# Patient Record
Sex: Male | Born: 1945 | Race: Black or African American | Hispanic: No | Marital: Married | Smoking: Never smoker
Health system: Southern US, Community
[De-identification: ages and names within clinical notes are randomized; demographics above are authoritative.]

## PROBLEM LIST (undated history)

## (undated) DIAGNOSIS — C61 Malignant neoplasm of prostate: Secondary | ICD-10-CM

## (undated) DIAGNOSIS — I10 Essential (primary) hypertension: Secondary | ICD-10-CM

## (undated) DIAGNOSIS — Z923 Personal history of irradiation: Secondary | ICD-10-CM

## (undated) DIAGNOSIS — A64 Unspecified sexually transmitted disease: Secondary | ICD-10-CM

## (undated) HISTORY — PX: OTHER SURGICAL HISTORY: SHX169

---

## 1968-09-27 DIAGNOSIS — A64 Unspecified sexually transmitted disease: Secondary | ICD-10-CM

## 1968-09-27 HISTORY — DX: Unspecified sexually transmitted disease: A64

## 2002-09-27 DIAGNOSIS — Z923 Personal history of irradiation: Secondary | ICD-10-CM

## 2002-09-27 HISTORY — DX: Personal history of irradiation: Z92.3

## 2018-06-13 ENCOUNTER — Other Ambulatory Visit: Payer: Self-pay | Admitting: Urology

## 2018-06-13 DIAGNOSIS — R9721 Rising PSA following treatment for malignant neoplasm of prostate: Secondary | ICD-10-CM

## 2018-06-22 ENCOUNTER — Encounter (HOSPITAL_COMMUNITY)
Admission: RE | Admit: 2018-06-22 | Discharge: 2018-06-22 | Disposition: A | Discharge status: Home / Self Care | Payer: Medicare Other | Source: Ambulatory Visit | Attending: Urology | Admitting: Urology

## 2018-06-22 DIAGNOSIS — R9721 Rising PSA following treatment for malignant neoplasm of prostate: Secondary | ICD-10-CM | POA: Diagnosis not present

## 2018-06-22 MED ORDER — AXUMIN (FLUCICLOVINE F 18) INJECTION
9.5000 | Freq: Once | INTRAVENOUS | Status: AC
  Administered 2018-06-22: 9.5 via INTRAVENOUS

## 2018-07-13 ENCOUNTER — Encounter: Payer: Self-pay | Admitting: Medical Oncology

## 2018-07-13 ENCOUNTER — Telehealth: Payer: Self-pay | Admitting: Medical Oncology

## 2018-07-13 NOTE — Telephone Encounter (Signed)
Left message requesting a return call to discuss referral to the PMDC.  

## 2018-07-17 ENCOUNTER — Telehealth: Payer: Self-pay | Admitting: Medical Oncology

## 2018-07-17 NOTE — Telephone Encounter (Signed)
Left reminder message for [facility] appointment 10/22 arriving at 12:30 pm. I reviewed location, valet parking and reminded him to bring his completed medical forms. I also asked him to have lunch before arrival due to length of clinic.

## 2018-07-18 ENCOUNTER — Other Ambulatory Visit: Payer: Self-pay

## 2018-07-18 ENCOUNTER — Inpatient Hospital Stay: Payer: Medicare Other | Attending: Oncology | Admitting: Oncology

## 2018-07-18 ENCOUNTER — Ambulatory Visit
Admission: RE | Admit: 2018-07-18 | Discharge: 2018-07-18 | Disposition: A | Discharge status: Home / Self Care | Payer: Medicare Other | Source: Ambulatory Visit | Attending: Urology | Admitting: Urology

## 2018-07-18 ENCOUNTER — Encounter: Payer: Self-pay | Admitting: Medical Oncology

## 2018-07-18 ENCOUNTER — Encounter: Payer: Self-pay | Admitting: Radiation Oncology

## 2018-07-18 VITALS — BP 162/77 | HR 81 | Temp 98.2°F | Resp 18 | Ht 72.0 in | Wt 189.0 lb

## 2018-07-18 DIAGNOSIS — I10 Essential (primary) hypertension: Secondary | ICD-10-CM | POA: Insufficient documentation

## 2018-07-18 DIAGNOSIS — C61 Malignant neoplasm of prostate: Secondary | ICD-10-CM | POA: Insufficient documentation

## 2018-07-18 DIAGNOSIS — Z79899 Other long term (current) drug therapy: Secondary | ICD-10-CM | POA: Diagnosis not present

## 2018-07-18 HISTORY — DX: Personal history of irradiation: Z92.3

## 2018-07-18 HISTORY — DX: Malignant neoplasm of prostate: C61

## 2018-07-18 HISTORY — DX: Unspecified sexually transmitted disease: A64

## 2018-07-18 NOTE — Consult Note (Signed)
[facility]     07/18/2018   --------------------------------------------------------------------------------   Dale Ryan  MRN: [id number]  PRIMARY CARE:    DOB: 06/10/1946, 72 year old Male  REFERRING:  Irine Seal, MD  SSN:   PROVIDER:  Irine Seal, M.D.    TREATING:  Raynelle Bring, M.D.    LOCATION:  [facility] [phone]   --------------------------------------------------------------------------------   CC/HPI: CC: Prostate Cancer   Physician requesting consult: Dr. Irine Seal  PCP: [facility]  Location of consult: [facility] - Prostate Cancer Multidisciplinary Clinic   Dale Ryan is a 72 year old gentleman who was diagnosed with prostate cancer by Dr. Marla Roe and subsequently treated with EBRT at [facility] in 2004 for clinical stage T1c, low volume Gleason 3+3=6 adenocarcinoma with a pretreatment PSA of 10.9. He reached a PSA nadir of 1.3 in 2007. His PSA began increasing and had reached a level of 3.1 in February of 2012. He then stopped following up at [facility]. He had apparently not been undergoing PSA surveillance for many years and was recently noted to have an elevated PSA of 10.78 by his PCP. He was evaluated by Dr. Jeffie Pollock and his repeat PSA was 14.2. His DRE was unremarkable. He underwent a fluciclovine PET scan on 06/22/18 that did not demonstrate evidence of measurable metastatic disease. He then underwent a TRUS biopsy of the prostate on 07/05/18 that revealed a 106 cc prostate with 6 out of 12 biopsy cores positive for malignancy with Gleason 4+3=7 disease noted.   He remains in excellent overall health. He has significant longevity in his family with his mother living to be age 6 multiple grandparents that lived to be 49.   Family history: Positive   Imaging studies:  Fluciclovine PET (06/22/18) - negative for metastatic disease   PMH: He has a history of hypertension.  PSH: No abdominal  surgeries.   TNM stage: cT1c N0 M0  PSA: 14.2  Gleason score: 4+3=7  Biopsy (07/05/18): 6/12 cores positive  Left: L apex (30%, 3+4=7, PNI). L mid (5%, 4+3=7)  Right: R apex (70%, 3+4=7), R lateral apex (90%, 3+4=7, PNI), R mid (40%, 3+4=7), R lateral mid (40%, 3+4=7, PNI)  Prostate volume: 106 cc   Urinary function: IPSS is 2.  Erectile function: SHIM score is 5.     ALLERGIES: None   MEDICATIONS: Amlodipine Besylate 5 mg tablet 1 tablet PO Daily  Hydrochlorothiazide 12.5 mg capsule 1 capsule PO Daily     GU PSH: Prostate Needle Biopsy - 07/05/2018    NON-GU PSH: Surgical Pathology, Gross And Microscopic Examination For Prostate Needle - 07/05/2018    GU PMH: Elevated PSA - 06/28/2018 Prostate Cancer (Worsening) - 06/12/2018 Rising PSA after prostate cancer treatment, He has an elevated PSA 15 years post radiation for prostate cancer. I am going to get him set up for restaging with an Axumin PET scan. He will return with the results and subsequent therapy will depend on the location of the probable recurrent disease. - 06/12/2018      PMH Notes: STD  Radiation therapy for prostate cancer in 2004.    NON-GU PMH: Encounter for general adult medical examination without abnormal findings, Encounter for preventive health examination Hypertension    FAMILY HISTORY: Prostate Cancer - Runs in Family   SOCIAL HISTORY: Marital Status: Married Preferred Language: English; Race: Black or African American Current Smoking Status: Patient has never smoked.   Tobacco Use Assessment Completed: Used  Tobacco in last 30 days? Does not drink caffeine. Patient's occupation is/was Retired.    REVIEW OF SYSTEMS:    GU Review Male:   Patient denies frequent urination, hard to postpone urination, burning/ pain with urination, get up at night to urinate, leakage of urine, stream starts and stops, trouble starting your streams, and have to strain to urinate .  Gastrointestinal (Upper):   Patient  denies nausea and vomiting.  Gastrointestinal (Lower):   Patient denies diarrhea and constipation.  Constitutional:   Patient denies fever, night sweats, weight loss, and fatigue.  Skin:   Patient denies skin rash/ lesion and itching.  Eyes:   Patient denies blurred vision and double vision.  Ears/ Nose/ Throat:   Patient denies sore throat and sinus problems.  Hematologic/Lymphatic:   Patient denies swollen glands and easy bruising.  Cardiovascular:   Patient denies leg swelling and chest pains.  Respiratory:   Patient denies cough and shortness of breath.  Endocrine:   Patient denies excessive thirst.  Musculoskeletal:   Patient denies back pain and joint pain.  Neurological:   Patient denies headaches and dizziness.  Psychologic:   Patient denies depression and anxiety.   VITAL SIGNS: None   MULTI-SYSTEM PHYSICAL EXAMINATION:    Constitutional: Well-nourished. No physical deformities. Normally developed. Good grooming.     PAST DATA REVIEWED:  Source Of History:  Patient  Lab Test Review:   PSA  Records Review:   Pathology Reports  Urine Test Review:   Urinalysis  X-Ray Review: PET Scan: Reviewed Films.     06/28/18 05/09/18  PSA  Total PSA 14.20 ng/mL 10.78 ng/dl    PROCEDURES: None   ASSESSMENT:      ICD-10 Details  1 GU:   Prostate Cancer - C61    PLAN:           Document Letter(s):  Created for Patient: Clinical Summary         Notes:   1. Biochemically recurrent prostate cancer: I had a discussion with Dale Ryan today regarding his biochemically recurrent prostate cancer with evidence of local recurrence in the absence of clear systemic disease. We did discuss the need to proceed with appropriate bone imaging to absolutely rule out the possibility of systemic disease.   Assuming that he would not have evidence of metastatic disease, we then discuss the options of proceeding with salvage local curative therapy versus ongoing surveillance with plans to  institute systemic therapy in the future as a noncurative approach.   We discussed options for salvage local curative therapy including salvage radical prostatectomy and salvage ablative cryotherapy. We reviewed the pros and cons of each of these approaches and specifically discussed the potential side effects of these therapies particularly related to urinary, sexual, and bowel function. He did not appear to be particularly interested in salvage cryotherapy, and in particular, was very concerned about the small the possible risk for fistula formation. He did express some mild interest in salvage radical prostatectomy. We discussed the significant risk of urinary incontinence in the post radiation setting. I also discussed his PSA which is slightly increased above the level when we would like to detect potentially curable disease after radiation therapy. However, I would certainly offer him this option if the remainder of his metastatic evaluation is negative.   We also discussed the option of continuing with surveillance with a noncurative approach using systemic therapy in the future when necessary. He is scheduled to see Dr. Alen Blew later today. He understands  that his life expectancy is such that he very likely will developed metastatic and symptomatic disease if he does live as long as many of his family members. He understands that the benefit of this approach would be to preserve his quality of life currently. We did begin to review any of the side effects related to systemic therapy that would be necessary in the future.   He is going to consider his options of proceeding with salvage, local curative therapy likely with a salvage prostatectomy versus an approach of continued surveillance monitoring with a noncurative approach within institution of systemic therapy in the future when necessary. He feels well informed and will notify us of his decision and how he would like to proceed. If he does  ultimately wish to consider surgical treatment, we will plan to schedule this but I will have him return for a physical exam and further discussion preoperatively.    CC: Dr. Irine Seal    E & M CODE: I spent at least 52 minutes face to face with the patient, more than 50% of that time was spent on counseling and/or coordinating care.

## 2018-07-18 NOTE — Progress Notes (Signed)
Reason for the request: Prostate cancer     HPI: I was asked by Dr. Jeffie Pollock to evaluate Dale Ryan for prostate cancer.  He is a 72 year old man currently of [place] although he is originally from [place].  He was diagnosed with prostate cancer in 2004 and had a T1c disease with a Gleason score of 6.  He was treated with external beam radiation at that time with PSA nadir close to 1.  He did not follow-up at regularly after that and most recently his PSA was detected and was up to 10.78.  Repeat PSA by Dr. Jeffie Pollock showed a PSA of 14.2 on June 28, 2018.  He underwent an Axumin scan and PET images showed only uptake in the right apex of the prostate without any evidence of metastatic disease.  A biopsy obtained on 07/05/2018 showed a Gleason score of 4+3 equal 7 involving 5% of one core in addition of 3 other cores involving 3+4 equal 7 prostate cancer.  He is asymptomatic at this time without any urinary complaints.  He denies any frequency, dysuria or hematuria.  He does not report any headaches, blurry vision, syncope or seizures. Does not report any fevers, chills or sweats.  Does not report any cough, wheezing or hemoptysis.  Does not report any chest pain, palpitation, orthopnea or leg edema.  Does not report any nausea, vomiting or abdominal pain.  Does not report any constipation or diarrhea.  Does not report any skeletal complaints.    Does not report frequency, urgency or hematuria.  Does not report any skin rashes or lesions. Does not report any heat or cold intolerance.  Does not report any lymphadenopathy or petechiae.  Does not report any anxiety or depression.  Remaining review of systems is negative.    Past Medical History:  Diagnosis Date  . History of radiation therapy 2004  . Prostate cancer ([place])   . Venereal disease 1970  :  No past surgical history on file.:   Current Outpatient Medications:  .  amLODipine (NORVASC) 5 MG tablet, Take 5 mg by mouth daily., Disp: , Rfl:  0 .  desloratadine (CLARINEX) 5 MG tablet, Take 5 mg by mouth daily., Disp: , Rfl: 0 .  hydrochlorothiazide (MICROZIDE) 12.5 MG capsule, Take 12.5 mg by mouth daily., Disp: , Rfl: 0 .  levofloxacin (LEVAQUIN) 750 MG tablet, TAKE 1 TABLET BY MOUTH 1 HOUR PRIOR TO PROCEDURE, Disp: , Rfl: 0 .  Multiple Vitamin (MULTIVITAMIN) capsule, Take by mouth., Disp: , Rfl: :  Not on File:  No family history on file.:  Social History   Socioeconomic History  . Marital status: Married    Spouse name: Dale Ryan  . Number of children: 1  . Years of education: Not on file  . Highest education level: Not on file  Occupational History  . Occupation: retired  Scientific laboratory technician  . Financial resource strain: Not on file  . Food insecurity:    Worry: Not on file    Inability: Not on file  . Transportation needs:    Medical: Not on file    Non-medical: Not on file  Tobacco Use  . Smoking status: Never Smoker  . Smokeless tobacco: Never Used  Substance and Sexual Activity  . Alcohol use: Never    Frequency: Never  . Drug use: Never  . Sexual activity: Not Currently  Lifestyle  . Physical activity:    Days per week: Not on file    Minutes per session: Not on file  .  Stress: Not on file  Relationships  . Social connections:    Talks on phone: Not on file    Gets together: Not on file    Attends religious service: Not on file    Active member of club or organization: Not on file    Attends meetings of clubs or organizations: Not on file    Relationship status: Not on file  . Intimate partner violence:    Fear of current or ex partner: Not on file    Emotionally abused: Not on file    Physically abused: Not on file    Forced sexual activity: Not on file  Other Topics Concern  . Not on file  Social History Narrative  . Not on file  :  Pertinent items are noted in HPI.  Exam: ECOG 0 General appearance: alert and cooperative appeared without distress. Head: atraumatic without any  abnormalities. Eyes: conjunctivae/corneas clear. PERRL.  Sclera anicteric. Throat: lips, mucosa, and tongue normal; without oral thrush or ulcers. Resp: clear to auscultation bilaterally without rhonchi, wheezes or dullness to percussion. Cardio: regular rate and rhythm, S1, S2 normal, no murmur, click, rub or gallop GI: soft, non-tender; bowel sounds normal; no masses,  no organomegaly Skin: Skin color, texture, turgor normal. No rashes or lesions Lymph nodes: Cervical, supraclavicular, and axillary nodes normal. Neurologic: Grossly normal without any motor, sensory or deep tendon reflexes. Musculoskeletal: No joint deformity or effusion.     Nm Pet (axumin) Skull Base To Mid Thigh  Result Date: 06/22/2018 CLINICAL DATA:  Prostate carcinoma with biochemical recurrence. PSA equal 10.7 EXAM: NUCLEAR MEDICINE PET SKULL BASE TO THIGH TECHNIQUE: 9.5 mCi F-18 Fluciclovine was injected intravenously. Full-ring PET imaging was performed from the skull base to thigh after the radiotracer. CT data was obtained and used for attenuation correction and anatomic localization. COMPARISON:  None. FINDINGS: NECK No radiotracer activity in neck lymph nodes. Incidental CT finding: None CHEST No radiotracer accumulation within mediastinal or hilar lymph nodes. No suspicious pulmonary nodules on the CT scan. Incidental CT finding: None ABDOMEN/PELVIS Prostate: Asymmetric activity within the posterior RIGHT apex with SUV max equal 6.1. Lymph nodes: No abnormal radiotracer accumulation within pelvic or abdominal nodes. Liver: No evidence of liver metastasis Incidental CT finding: Large hepatic cysts. SKELETON No focal activity to suggest skeletal metastasis. Remote pubic symphysis fractures versus chronic degenerative change. IMPRESSION: 1. Focal activity in the RIGHT lobe of the prostate gland may represent residual or recurrent prostate carcinoma. 2. No evidence of metastatic adenopathy in the pelvis. 3. No evidence of  distant soft tissue metastasis. 4. No evidence skeletal metastasis. Electronically Signed   By: Suzy Bouchard M.D.   On: 06/22/2018 17:26    Assessment and Plan:    72 year old man prostate cancer diagnosed in 2004 we had a Gleason score 3+3 equal 6, T1c disease.  He was treated with external beam radiation and developed recurrent disease that has been documented on a biopsy in October 2019 with a Gleason score 4+3 equal 7.  His case was discussed today in the prostate cancer multidisciplinary clinic.  His imaging studies were discussed with radiology and his pathology slides were reviewed by radiology.  Treatment options were reviewed today with the patient.  These options would include definitive local therapy with cryoablation or salvage prostatectomy.  Alternatively, deferring therapy for the time being which will increase his risk of developing locally advanced disease and possibly metastatic disease.  The role for systemic therapy in the setting of advanced disease  was discussed today.  He understands at that point, treatments are palliative and not curative.  These options would include androgen deprivation therapy, second line hormone therapy, immune therapy and systemic chemotherapy.  After discussion today, he is leaning towards salvage prostatectomy which he has discussed with Dr. Alinda Money today.  30  minutes was spent with the patient face-to-face today.  More than 50% of time was dedicated to patient counseling, education and reviewing imaging studies and pathology.   Thank you for the referral. A copy of this consult has been forwarded to the requesting physician.

## 2018-07-18 NOTE — Progress Notes (Signed)
GU Location of Tumor / Histology: prostatic adenocarcinoma  If Prostate Cancer, Gleason Score is (4 + 3) and PSA is (14.20) 15 years out from EXRT.       Past/Anticipated interventions by urology, if any: biopsy, axumin study, referral to [facility]  Past/Anticipated interventions by medical oncology, if any: no  Weight changes, if any: no  Bowel/Bladder complaints, if any: minimal LUTS   Nausea/Vomiting, if any: no  Pain issues, if any:  no  SAFETY ISSUES:  Prior radiation? Yes in 2004  Pacemaker/ICD? no  Possible current pregnancy? no  Is the patient on methotrexate? no  Current Complaints / other details:  72 year old male. Married. Retired.

## 2018-07-18 NOTE — Progress Notes (Signed)
                               Care Plan Summary  Name: Mr. Muhammed Teutsch DOB: October 22, 1945   Your Medical Team:   Urologist -  Dr. Raynelle Bring, [facility]  Radiation Oncologist - Dr. Tyler Pita, [facility]   Medical Oncologist - Dr. Zola Button, [facility]  Recommendations: 1) Bone Scan- staging 2) Continue surveillance  3) Salvage  prostatectomy  * These recommendations are based on information available as of today's consult.      Recommendations may change depending on the results of further tests or exams.  Next Steps: 1) Dr. Ralene Muskrat office will schedule  bone scan  2) Consider your options and call Robin or Dr. Jeffie Pollock   When appointments need to be scheduled, you will be contacted by [facility] and/or [facility].  Questions?  Please do not hesitate to call Cira Rue, RN, BSN, OCN at [phone] any questions or concerns.  Shirlean Mylar is your Oncology Nurse Navigator and is available to assist you while you're receiving your medical care at [facility].

## 2018-07-19 ENCOUNTER — Other Ambulatory Visit: Payer: Self-pay | Admitting: Urology

## 2018-07-19 ENCOUNTER — Telehealth: Payer: Self-pay

## 2018-07-19 DIAGNOSIS — C61 Malignant neoplasm of prostate: Secondary | ICD-10-CM

## 2018-07-19 DIAGNOSIS — R9721 Rising PSA following treatment for malignant neoplasm of prostate: Secondary | ICD-10-CM

## 2018-07-19 NOTE — Telephone Encounter (Signed)
Per 10/22 no los 

## 2018-07-20 ENCOUNTER — Encounter: Payer: Self-pay | Admitting: General Practice

## 2018-07-20 NOTE — Progress Notes (Signed)
[place] Psychosocial Distress Screening Spiritual Care  Mr Reifsteck was seen in [facility] to introduce [facility] team/resources, reviewing distress screen per protocol.  The patient scored a 0 on the Psychosocial Distress Thermometer which indicates minimal distress.   ONCBCN DISTRESS SCREENING 07/20/2018  Screening Type Initial Screening  Distress experienced in past week (1-10) 0  Referral to support programs Yes    Follow up needed: No. Mr Klaus received full packet of [place]. LVM offering further support via [place], but please page if immediate needs arise or circumstances change. Thank you.   [place], [place], [facility] Pager [phone] Voicemail [phone]

## 2018-07-25 ENCOUNTER — Telehealth: Payer: Self-pay | Admitting: Medical Oncology

## 2018-07-25 NOTE — Telephone Encounter (Signed)
Left message as follow up to [facility]. He is scheduled for bone scan 11/01 arriving at 10:00 am [facility]. I asked him to call me with questions or concerns.

## 2018-07-28 ENCOUNTER — Encounter (HOSPITAL_COMMUNITY)
Admission: RE | Admit: 2018-07-28 | Discharge: 2018-07-28 | Disposition: A | Discharge status: Home / Self Care | Payer: Medicare Other | Source: Ambulatory Visit | Attending: Urology | Admitting: Urology

## 2018-07-28 DIAGNOSIS — R9721 Rising PSA following treatment for malignant neoplasm of prostate: Secondary | ICD-10-CM | POA: Diagnosis present

## 2018-07-28 DIAGNOSIS — C61 Malignant neoplasm of prostate: Secondary | ICD-10-CM | POA: Insufficient documentation

## 2018-07-28 MED ORDER — TECHNETIUM TC 99M MEDRONATE IV KIT
20.0000 | PACK | Freq: Once | INTRAVENOUS | Status: AC | PRN
  Administered 2018-07-28: 21.8 via INTRAVENOUS

## 2018-08-17 ENCOUNTER — Telehealth: Payer: Self-pay | Admitting: Medical Oncology

## 2018-08-17 NOTE — Telephone Encounter (Signed)
Patient called stating he has decided to move forward with salvage prostatectomy. He I snot sure how to get in touch with Dr. Alinda Money with his treatment decision. I will be happy to forward this decision to Dr. Alinda Money. He is aware he will receive a call from Dr. Lynne Logan office to schedule a follow up appointment to discuss surgery. Message forwarded to Dr. Alinda Money.

## 2018-08-18 ENCOUNTER — Telehealth: Payer: Self-pay | Admitting: Medical Oncology

## 2018-08-18 NOTE — Telephone Encounter (Signed)
Spoke with Dale Ryan to inform him of appointment with Dr. Alinda Money 11/26 at 8:00 am to discuss salvage prostatectomy. He voiced understanding.

## 2018-08-22 ENCOUNTER — Other Ambulatory Visit: Payer: Self-pay | Admitting: Urology

## 2018-09-08 NOTE — Patient Instructions (Signed)
Dale Ryan  09/08/2018   Your procedure is scheduled on: Thursday 09/14/2018  Report to [facility] Main  Entrance              Report to admitting at  0530  AM    Call this number if you have problems the morning of surgery [phone]               Follow bowel prep instructions from Dr. Lynne Logan office the day before  surgery and follow a clear liquid diet all day up until midnight!             Drink one 8 ounce bottle Magnesium Citrate at noon the day before surgery.             Use one Fleet enema night  before surgery.    CLEAR LIQUID DIET   Foods Allowed                                                                     Foods Excluded  Coffee and tea, regular and decaf                             liquids that you cannot  Plain Jell-O in any flavor                                             see through such as: Fruit ices (not with fruit pulp)                                     milk, soups, orange juice  Iced Popsicles                                    All solid food Carbonated beverages, regular and diet                                    Cranberry, grape and apple juices Sports drinks like Gatorade Lightly seasoned clear broth or consume(fat free) Sugar, honey syrup  Sample Menu Breakfast                                Lunch                                     Supper Cranberry juice                    Beef broth                            Chicken broth Jell-O  Grape juice                           Apple juice Coffee or tea                        Jell-O                                      Popsicle                                                Coffee or tea                        Coffee or tea  _____________________________________________________________________    Remember: Do not eat food or drink liquids :After Midnight.              BRUSH YOUR TEETH MORNING OF SURGERY AND RINSE YOUR MOUTH  OUT, NO CHEWING GUM CANDY OR MINTS.     Take these medicines the morning of surgery with A SIP OF WATER: Amlodipine (Norvasc)                                You may not have any metal on your body including hair pins and              piercings  Do not wear jewelry, make-up, lotions, powders or perfumes, deodorant                         Men may shave face and neck.   Do not bring valuables to the hospital. [place].  Contacts, dentures or bridgework may not be worn into surgery.  Leave suitcase in the car. After surgery it may be brought to your room.                  Please read over the following fact sheets you were given: _____________________________________________________________________             [facility] - Preparing for Surgery Before surgery, you can play an important role.  Because skin is not sterile, your skin needs to be as free of germs as possible.  You can reduce the number of germs on your skin by washing with CHG (chlorahexidine gluconate) soap before surgery.  CHG is an antiseptic cleaner which kills germs and bonds with the skin to continue killing germs even after washing. Please DO NOT use if you have an allergy to CHG or antibacterial soaps.  If your skin becomes reddened/irritated stop using the CHG and inform your nurse when you arrive at Short Stay. Do not shave (including legs and underarms) for at least 48 hours prior to the first CHG shower.  You may shave your face/neck. Please follow these instructions carefully:  1.  Shower with CHG Soap the night before surgery and the  morning of Surgery.  2.  If you choose to wash your hair, wash your hair first as usual with your  normal  shampoo.  3.  After you shampoo, rinse your hair and body thoroughly to remove the  shampoo.                           4.  Use CHG as you would any other liquid soap.  You can apply chg directly  to the skin and wash                        Gently with a scrungie or clean washcloth.  5.  Apply the CHG Soap to your body ONLY FROM THE NECK DOWN.   Do not use on face/ open                           Wound or open sores. Avoid contact with eyes, ears mouth and genitals (private parts).                       Wash face,  Genitals (private parts) with your normal soap.             6.  Wash thoroughly, paying special attention to the area where your surgery  will be performed.  7.  Thoroughly rinse your body with warm water from the neck down.  8.  DO NOT shower/wash with your normal soap after using and rinsing off  the CHG Soap.                9.  Pat yourself dry with a clean towel.            10.  Wear clean pajamas.            11.  Place clean sheets on your bed the night of your first shower and do not  sleep with pets. Day of Surgery : Do not apply any lotions/deodorants the morning of surgery.  Please wear clean clothes to the hospital/surgery center.  FAILURE TO FOLLOW THESE INSTRUCTIONS MAY RESULT IN THE CANCELLATION OF YOUR SURGERY PATIENT SIGNATURE_________________________________  NURSE SIGNATURE__________________________________  ________________________________________________________________________   Dale Ryan  An incentive spirometer is a tool that can help keep your lungs clear and active. This tool measures how well you are filling your lungs with each breath. Taking long deep breaths may help reverse or decrease the chance of developing breathing (pulmonary) problems (especially infection) following:  A long period of time when you are unable to move or be active. BEFORE THE PROCEDURE   If the spirometer includes an indicator to show your best effort, your nurse or respiratory therapist will set it to a desired goal.  If possible, sit up straight or lean slightly forward. Try not to slouch.  Hold the incentive spirometer in an upright position. INSTRUCTIONS FOR USE  1. Sit on the edge  of your bed if possible, or sit up as far as you can in bed or on a chair. 2. Hold the incentive spirometer in an upright position. 3. Breathe out normally. 4. Place the mouthpiece in your mouth and seal your lips tightly around it. 5. Breathe in slowly and as deeply as possible, raising the piston or the ball toward the top of the column. 6. Hold your breath for 3-5 seconds or for as long as possible. Allow the piston or ball to fall to the bottom of the column. 7. Remove the mouthpiece from your mouth and  breathe out normally. 8. Rest for a few seconds and repeat Steps 1 through 7 at least 10 times every 1-2 hours when you are awake. Take your time and take a few normal breaths between deep breaths. 9. The spirometer may include an indicator to show your best effort. Use the indicator as a goal to work toward during each repetition. 10. After each set of 10 deep breaths, practice coughing to be sure your lungs are clear. If you have an incision (the cut made at the time of surgery), support your incision when coughing by placing a pillow or rolled up towels firmly against it. Once you are able to get out of bed, walk around indoors and cough well. You may stop using the incentive spirometer when instructed by your caregiver.  RISKS AND COMPLICATIONS  Take your time so you do not get dizzy or light-headed.  If you are in pain, you may need to take or ask for pain medication before doing incentive spirometry. It is harder to take a deep breath if you are having pain. AFTER USE  Rest and breathe slowly and easily.  It can be helpful to keep track of a log of your progress. Your caregiver can provide you with a simple table to help with this. If you are using the spirometer at home, follow these instructions: [place] IF:   You are having difficultly using the spirometer.  You have trouble using the spirometer as often as instructed.  Your pain medication is not giving enough  relief while using the spirometer.  You develop fever of 100.5 F (38.1 C) or higher. SEEK IMMEDIATE MEDICAL CARE IF:   You cough up bloody sputum that had not been present before.  You develop fever of 102 F (38.9 C) or greater.  You develop worsening pain at or near the incision site. MAKE SURE YOU:   Understand these instructions.  Will watch your condition.  Will get help right away if you are not doing well or get worse. Document Released: 01/24/2007 Document Revised: 12/06/2011 Document Reviewed: 03/27/2007 Exit[employer] Patient Information 2014 Exit[employer], [place].   ________________________________________________________________________  WHAT IS A BLOOD TRANSFUSION? Blood Transfusion Information  A transfusion is the replacement of blood or some of its parts. Blood is made up of multiple cells which provide different functions.  Red blood cells carry oxygen and are used for blood loss replacement.  White blood cells fight against infection.  Platelets control bleeding.  Plasma helps clot blood.  Other blood products are available for specialized needs, such as hemophilia or other clotting disorders. BEFORE THE TRANSFUSION  Who gives blood for transfusions?   Healthy volunteers who are fully evaluated to make sure their blood is safe. This is blood bank blood. Transfusion therapy is the safest it has ever been in the practice of medicine. Before blood is taken from a donor, a complete history is taken to make sure that person has no history of diseases nor engages in risky social behavior (examples are intravenous drug use or sexual activity with multiple partners). The donor's travel history is screened to minimize risk of transmitting infections, such as malaria. The donated blood is tested for signs of infectious diseases, such as HIV and hepatitis. The blood is then tested to be sure it is compatible with you in order to minimize the chance of a transfusion reaction. If  you or a relative donates blood, this is often done in anticipation of surgery and is not appropriate for  emergency situations. It takes many days to process the donated blood. RISKS AND COMPLICATIONS Although transfusion therapy is very safe and saves many lives, the main dangers of transfusion include:   Getting an infectious disease.  Developing a transfusion reaction. This is an allergic reaction to something in the blood you were given. Every precaution is taken to prevent this. The decision to have a blood transfusion has been considered carefully by your caregiver before blood is given. Blood is not given unless the benefits outweigh the risks. AFTER THE TRANSFUSION  Right after receiving a blood transfusion, you will usually feel much better and more energetic. This is especially true if your red blood cells have gotten low (anemic). The transfusion raises the level of the red blood cells which carry oxygen, and this usually causes an energy increase.  The nurse administering the transfusion will monitor you carefully for complications. HOME CARE INSTRUCTIONS  No special instructions are needed after a transfusion. You may find your energy is better. Speak with your caregiver about any limitations on activity for underlying diseases you may have. SEEK MEDICAL CARE IF:   Your condition is not improving after your transfusion.  You develop redness or irritation at the intravenous (IV) site. SEEK IMMEDIATE MEDICAL CARE IF:  Any of the following symptoms occur over the next 12 hours:  Shaking chills.  You have a temperature by mouth above 102 F (38.9 C), not controlled by medicine.  Chest, back, or muscle pain.  People around you feel you are not acting correctly or are confused.  Shortness of breath or difficulty breathing.  Dizziness and fainting.  You get a rash or develop hives.  You have a decrease in urine output.  Your urine turns a dark color or changes to pink,  red, or brown. Any of the following symptoms occur over the next 10 days:  You have a temperature by mouth above 102 F (38.9 C), not controlled by medicine.  Shortness of breath.  Weakness after normal activity.  The white part of the eye turns yellow (jaundice).  You have a decrease in the amount of urine or are urinating less often.  Your urine turns a dark color or changes to pink, red, or brown. Document Released: 09/10/2000 Document Revised: 12/06/2011 Document Reviewed: 04/29/2008 [facility] Patient Information 2014 [place], [place].  _______________________________________________________________________

## 2018-09-11 ENCOUNTER — Other Ambulatory Visit: Payer: Self-pay

## 2018-09-11 ENCOUNTER — Encounter (HOSPITAL_COMMUNITY)
Admission: RE | Admit: 2018-09-11 | Discharge: 2018-09-11 | Disposition: A | Discharge status: Home / Self Care | Payer: Medicare Other | Source: Ambulatory Visit | Attending: Urology | Admitting: Urology

## 2018-09-11 ENCOUNTER — Encounter (HOSPITAL_COMMUNITY): Payer: Self-pay

## 2018-09-11 DIAGNOSIS — Z01818 Encounter for other preprocedural examination: Secondary | ICD-10-CM | POA: Insufficient documentation

## 2018-09-11 DIAGNOSIS — I10 Essential (primary) hypertension: Secondary | ICD-10-CM

## 2018-09-11 DIAGNOSIS — Z923 Personal history of irradiation: Secondary | ICD-10-CM | POA: Diagnosis not present

## 2018-09-11 DIAGNOSIS — C61 Malignant neoplasm of prostate: Secondary | ICD-10-CM | POA: Diagnosis not present

## 2018-09-11 DIAGNOSIS — Z79899 Other long term (current) drug therapy: Secondary | ICD-10-CM | POA: Diagnosis not present

## 2018-09-11 HISTORY — DX: Essential (primary) hypertension: I10

## 2018-09-11 LAB — BASIC METABOLIC PANEL
Anion gap: 8 (ref 5–15)
BUN: 15 mg/dL (ref 8–23)
CALCIUM: 8.9 mg/dL (ref 8.9–10.3)
CO2: 28 mmol/L (ref 22–32)
Chloride: 104 mmol/L (ref 98–111)
Creatinine, Ser: 1.09 mg/dL (ref 0.61–1.24)
GFR calc Af Amer: >60 mL/min (ref >60)
Glucose, Bld: 105 mg/dL — ABNORMAL HIGH (ref 70–99)
Potassium: 3.6 mmol/L (ref 3.5–5.1)
SODIUM: 140 mmol/L (ref 135–145)

## 2018-09-11 LAB — CBC
HCT: 40.2 % (ref 39.0–52.0)
Hemoglobin: 13.1 g/dL (ref 13.0–17.0)
MCH: 29.4 pg (ref 26.0–34.0)
MCHC: 32.6 g/dL (ref 30.0–36.0)
MCV: 90.3 fL (ref 80.0–100.0)
Platelets: 210 10*3/uL (ref 150–400)
RBC: 4.45 MIL/uL (ref 4.22–5.81)
RDW: 15.5 % (ref 11.5–15.5)
WBC: 3.5 10*3/uL — ABNORMAL LOW (ref 4.0–10.5)
nRBC: 0 % (ref 0.0–0.2)

## 2018-09-11 LAB — ABO/RH: ABO/RH(D): O POS

## 2018-09-11 MED ORDER — MAGNESIUM CITRATE PO SOLN
1.0000 | Freq: Once | ORAL | Status: DC
  Filled 2018-09-11: qty 296

## 2018-09-11 MED ORDER — FLEET ENEMA 7-19 GM/118ML RE ENEM
1.0000 | ENEMA | Freq: Once | RECTAL | Status: DC
  Filled 2018-09-11: qty 1

## 2018-09-13 NOTE — Anesthesia Preprocedure Evaluation (Addendum)
Anesthesia Evaluation  Patient identified by MRN, date of birth, ID band Patient awake    Reviewed: Allergy & Precautions, H&P , NPO status , Patient's Chart, lab work & pertinent test results  Airway Mallampati: II  TM Distance: >3 FB Neck ROM: Full    Dental no notable dental hx. (+) Teeth Intact, Dental Advisory Given   Pulmonary neg pulmonary ROS,    Pulmonary exam normal breath sounds clear to auscultation       Cardiovascular Exercise Tolerance: Good hypertension, Pt. on medications  Rhythm:Regular Rate:Normal     Neuro/Psych negative neurological ROS  negative psych ROS   GI/Hepatic negative GI ROS, Neg liver ROS,   Endo/Other  negative endocrine ROS  Renal/GU negative Renal ROS  negative genitourinary   Musculoskeletal   Abdominal   Peds  Hematology negative hematology ROS (+)   Anesthesia Other Findings   Reproductive/Obstetrics negative OB ROS                           Anesthesia Physical Anesthesia Plan  ASA: II  Anesthesia Plan: General   Post-op Pain Management:    Induction: Intravenous  PONV Risk Score and Plan: 3 and Ondansetron, Dexamethasone and Midazolam  Airway Management Planned: Oral ETT  Additional Equipment:   Intra-op Plan:   Post-operative Plan: Extubation in OR  Informed Consent: I have reviewed the patients History and Physical, chart, labs and discussed the procedure including the risks, benefits and alternatives for the proposed anesthesia with the patient or authorized representative who has indicated his/her understanding and acceptance.     Dental advisory given  Plan Discussed with: CRNA  Anesthesia Plan Comments:         Anesthesia Quick Evaluation  

## 2018-09-13 NOTE — H&P (Signed)
Office Visit Report     08/22/2018   --------------------------------------------------------------------------------   Dale Ryan  MRN: [id number]  PRIMARY CARE:    DOB: [birth year], 72 year old Male  REFERRING:  [place]  SSN:   PROVIDER:  Irine Seal, M.D.    TREATING:  Raynelle Bring, M.D.    LOCATION:  [facility] [phone]   --------------------------------------------------------------------------------   CC/HPI: CC: Prostate Cancer   Dale Ryan is a 72 year old gentleman who was diagnosed with prostate cancer by Dr. 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 He reached a PSA nadir of 1.3 in 2007. His PSA began increasing and had reached a level of 3.1 in February of 2012. He then stopped following up at [facility]. He had apparently not been undergoing PSA surveillance for many years and was recently noted to have an elevated PSA of 10.78 by his PCP. He was evaluated by Dr. Jeffie Pollock and his repeat PSA was 14.2. His DRE was unremarkable. He underwent a fluciclovine PET scan on 06/22/18 that did not demonstrate evidence of measurable metastatic disease. He then underwent a TRUS biopsy of the prostate on 07/05/18 that revealed a 106 cc prostate with 6 out of 12 biopsy cores positive for malignancy with Gleason 4+3=7 disease noted. He was recently seen in the multidisciplinary clinic and we discussed options for management. He has now elected to consider salvage prostatectomy for treatment.   He remains in excellent overall health. He has significant longevity in his family with his mother living to be age 43 multiple grandparents that lived to be 21.   Family history: Positive   Imaging studies:  Fluciclovine PET (06/22/18) - negative for metastatic disease  Bone scan (07/28/2018) - negative for metastatic disease    PMH: He has a history of hypertension.  PSH: No abdominal surgeries.   TNM stage: cT1c N0 M0  PSA: 14.2  Gleason score: 4+3=7  Biopsy (07/05/18): 6/12 cores positive  Left: L apex (30%, 3+4=7, PNI). L mid (5%, 4+3=7)  Right: R apex (70%, 3+4=7), R lateral apex (90%, 3+4=7, PNI), R mid (40%, 3+4=7), R lateral mid (40%, 3+4=7, PNI)  Prostate volume: 106 cc   Urinary function: IPSS is 2.  Erectile function: SHIM score is 5.     ALLERGIES: None   MEDICATIONS: Amlodipine Besylate 5 mg tablet 1 tablet PO Daily  Hydrochlorothiazide 12.5 mg capsule 1 capsule PO Daily     GU PSH: Prostate Needle Biopsy - 07/05/2018    NON-GU PSH: Surgical Pathology, Gross And Microscopic Examination For Prostate Needle - 07/05/2018    GU PMH: Prostate Cancer (Worsening) - 06/12/2018 Rising PSA after prostate cancer treatment, He has an elevated PSA 15 years post radiation for prostate cancer. I am going to get him set up for restaging with an Axumin PET scan. He will return with the results and subsequent therapy will depend on the location of the probable recurrent disease. - 06/12/2018      PMH Notes: STD  Radiation therapy for prostate cancer in 2004.    NON-GU PMH: Hypertension    FAMILY HISTORY: Prostate Cancer - Runs in Family   SOCIAL HISTORY: Marital Status: Married Preferred Language: English; Race: Black or African American Current Smoking Status: Patient has never smoked.   Tobacco Use Assessment Completed: Used Tobacco in last 30 days? Does not  drink caffeine. Patient's occupation is/was Retired.    REVIEW OF SYSTEMS:    GU Review Male:   Patient denies frequent urination, hard to postpone urination, burning/ pain with urination, get up at night to urinate, leakage of urine, stream starts and stops, trouble starting your streams, and have to strain to urinate .  Gastrointestinal (Upper):   Patient denies nausea and vomiting.  Gastrointestinal (Lower):   Patient denies diarrhea and  constipation.  Constitutional:   Patient denies fever, night sweats, weight loss, and fatigue.  Skin:   Patient denies skin rash/ lesion and itching.  Eyes:   Patient denies blurred vision and double vision.  Ears/ Nose/ Throat:   Patient denies sore throat and sinus problems.  Hematologic/Lymphatic:   Patient denies swollen glands and easy bruising.  Cardiovascular:   Patient denies leg swelling and chest pains.  Respiratory:   Patient denies cough and shortness of breath.  Endocrine:   Patient denies excessive thirst.  Musculoskeletal:   Patient denies back pain and joint pain.  Neurological:   Patient denies headaches and dizziness.  Psychologic:   Patient denies depression and anxiety.   VITAL SIGNS:      08/22/2018 08:04 AM  Weight 186 lb / 84.37 kg  Height 72 in / 182.88 cm  BP 164/74 mmHg  Pulse 75 /min  BMI 25.2 kg/m   GU PHYSICAL EXAMINATION:    Prostate: Prostate about 40 grams. There is noted be some firmness along the right side of prostate that is somewhat nonspecific. No definite extra prostatic extension noted on exam.   MULTI-SYSTEM PHYSICAL EXAMINATION:    Constitutional: Well-nourished. No physical deformities. Normally developed. Good grooming.  Neck: Neck symmetrical, not swollen. Normal tracheal position.  Respiratory: No labored breathing, no use of accessory muscles. Clear bilaterally  Cardiovascular: Normal temperature, normal extremity pulses, no swelling, no varicosities. Regular rate and rhythm.  Lymphatic: No enlargement of neck, axillae, groin.  Skin: No paleness, no jaundice, no cyanosis. No lesion, no ulcer, no rash.  Neurologic / Psychiatric: Oriented to time, oriented to place, oriented to person. No depression, no anxiety, no agitation.  Gastrointestinal: No mass, no tenderness, no rigidity, non obese abdomen.  Eyes: Normal conjunctivae. Normal eyelids.  Ears, Nose, Mouth, and Throat: Left ear no scars, no lesions, no masses. Right ear no scars, no  lesions, no masses. Nose no scars, no lesions, no masses. Normal hearing. Normal lips.  Musculoskeletal: Normal gait and station of head and neck.     PAST DATA REVIEWED:  Source Of History:  Patient  Lab Test Review:   PSA  Records Review:   Pathology Reports, Previous Patient Records  Urine Test Review:   Urinalysis  X-Ray Review: PET Scan: Reviewed Films.  Bone Scan: Reviewed Films.     06/28/18 05/09/18  PSA  Total PSA 14.20 ng/mL 10.78 ng/dl    PROCEDURES:          Urinalysis w/Scope Dipstick Dipstick Cont'd Micro  Color: Yellow Bilirubin: Neg mg/dL WBC/hpf: NS (Not Seen)  Appearance: Clear Ketones: Neg mg/dL RBC/hpf: 0 - 2/hpf  Specific Gravity: 1.025 Blood: Neg ery/uL Bacteria: NS (Not Seen)  pH: 6.0 Protein: 1+ mg/dL Cystals: NS (Not Seen)  Glucose: Neg mg/dL Urobilinogen: 0.2 mg/dL Casts: NS (Not Seen)    Nitrites: Neg Trichomonas: Not Present    Leukocyte Esterase: Neg leu/uL Mucous: Not Present      Epithelial Cells: 0 - 5/hpf      Yeast: NS (Not Seen)  Sperm: Not Present    ASSESSMENT:      ICD-10 Details  1 GU:   Prostate Cancer - C61   2   Rising PSA after prostate cancer treatment - R97.21    PLAN:           Schedule Return Visit/Planned Activity: Next Available Appointment - PT/OT Referral  Return Visit/Planned Activity: Other See Visit Notes             Note: Will call to schedule surgery          Document Letter(s):  Created for Patient: Clinical Summary         Notes:   1. 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 Dombkowski continuing our discussion from the multidisciplinary Clinic. He is now very interested in proceeding with a salvage radical prostatectomy for treatment. Considering his disease parameters, his excellent overall health, and the longevity in his family with most family members living into  their late 38s, I do think that salvage curative therapy is certainly a very reasonable option. We again reviewed the options for salvage curative treatment including ablative therapies as well as surgical treatment. He does adamantly wished to consider surgical therapy  .  We discussed surgical therapy for prostate cancer including the different available surgical approaches. We discussed, in detail, the risks and expectations of surgery with regard to cancer control, urinary control, and erectile function as well as the expected postoperative recovery process. Additional risks of surgery including but not limited to bleeding, infection, hernia formation, nerve damage, lymphocele formation, bowel/rectal injury potentially necessitating colostomy, damage to the urinary tract resulting in urine leakage, urethral stricture, and the cardiopulmonary risks such as myocardial infarction, stroke, death, venothromboembolism, etc. were explained. The risk of open surgical conversion for robotic/laparoscopic prostatectomy was also discussed.   Considering the salvage nature of his procedure, he understands the increased risk of urinary incontinence and the potential increased risk for rectal injury with possible need for colostomy. He does have pre-existing erectile dysfunction. All questions were answered to his stated satisfaction and does wish to proceed.   He will be scheduled for a salvage robot assisted laparoscopic radical prostatectomy and bilateral pelvic lymphadenectomy.   Cc: Dr. Irine Seal          Next Appointment:      Next Appointment: 08/29/2018 02:00 PM    Appointment Type: 6 Physical Therapy    Location: [facility] [phone]    Provider: Doran Durand    Reason for Visit: pt eval-Alamin Mccuiston      E & M CODE: I spent at least 42 minutes face to face with the patient, more than 50% of that time was spent on counseling and/or coordinating care.     * Signed by Raynelle Bring, M.D. on 08/22/18 at 11:22 AM (EST)*

## 2018-09-14 ENCOUNTER — Ambulatory Visit (HOSPITAL_COMMUNITY): Payer: Medicare Other | Admitting: Anesthesiology

## 2018-09-14 ENCOUNTER — Encounter (HOSPITAL_COMMUNITY): Payer: Self-pay | Admitting: *Deleted

## 2018-09-14 ENCOUNTER — Other Ambulatory Visit: Payer: Self-pay

## 2018-09-14 ENCOUNTER — Encounter (HOSPITAL_COMMUNITY)
Admission: RE | Disposition: A | Discharge status: Home / Self Care | Payer: Self-pay | Source: Ambulatory Visit | Attending: Urology

## 2018-09-14 ENCOUNTER — Observation Stay (HOSPITAL_COMMUNITY)
Admission: RE | Admit: 2018-09-14 | Discharge: 2018-09-15 | Disposition: A | Discharge status: Home / Self Care | Payer: Medicare Other | Source: Ambulatory Visit | Attending: Urology | Admitting: Urology

## 2018-09-14 DIAGNOSIS — Z923 Personal history of irradiation: Secondary | ICD-10-CM | POA: Diagnosis not present

## 2018-09-14 DIAGNOSIS — C61 Malignant neoplasm of prostate: Secondary | ICD-10-CM | POA: Diagnosis not present

## 2018-09-14 DIAGNOSIS — I10 Essential (primary) hypertension: Secondary | ICD-10-CM | POA: Diagnosis not present

## 2018-09-14 DIAGNOSIS — Z79899 Other long term (current) drug therapy: Secondary | ICD-10-CM | POA: Insufficient documentation

## 2018-09-14 HISTORY — PX: LYMPHADENECTOMY: SHX5960

## 2018-09-14 HISTORY — PX: ROBOT ASSISTED LAPAROSCOPIC RADICAL PROSTATECTOMY: SHX5141

## 2018-09-14 LAB — TYPE AND SCREEN
ABO/RH(D): O POS
ANTIBODY SCREEN: NEGATIVE

## 2018-09-14 LAB — HEMOGLOBIN AND HEMATOCRIT, BLOOD
HCT: 37.2 % — ABNORMAL LOW (ref 39.0–52.0)
Hemoglobin: 11.9 g/dL — ABNORMAL LOW (ref 13.0–17.0)

## 2018-09-14 SURGERY — XI ROBOTIC ASSISTED LAPAROSCOPIC RADICAL PROSTATECTOMY LEVEL 3
Anesthesia: General

## 2018-09-14 MED ORDER — DIPHENHYDRAMINE HCL 50 MG/ML IJ SOLN: 12.5000 mg | Freq: Four times a day (QID) | INTRAMUSCULAR | Status: DC | PRN

## 2018-09-14 MED ORDER — LACTATED RINGERS IV SOLN
INTRAVENOUS | Status: DC
  Administered 2018-09-14: 07:00:00 via INTRAVENOUS

## 2018-09-14 MED ORDER — HYDROCHLOROTHIAZIDE 12.5 MG PO CAPS
12.5000 mg | ORAL_CAPSULE | Freq: Every day | ORAL | Status: DC
  Administered 2018-09-14 – 2018-09-15 (×2): 12.5 mg via ORAL
  Filled 2018-09-14 (×2): qty 1

## 2018-09-14 MED ORDER — SODIUM CHLORIDE 0.9 % IR SOLN
Status: DC | PRN
  Administered 2018-09-14: 1000 mL via INTRAVESICAL

## 2018-09-14 MED ORDER — FENTANYL CITRATE (PF) 250 MCG/5ML IJ SOLN
INTRAMUSCULAR | Status: AC
  Filled 2018-09-14: qty 5

## 2018-09-14 MED ORDER — ZOLPIDEM TARTRATE 5 MG PO TABS: 5.0000 mg | ORAL_TABLET | Freq: Every evening | ORAL | Status: DC | PRN

## 2018-09-14 MED ORDER — PROPOFOL 10 MG/ML IV BOLUS
INTRAVENOUS | Status: DC | PRN
  Administered 2018-09-14: 170 mg via INTRAVENOUS

## 2018-09-14 MED ORDER — ONDANSETRON HCL 4 MG/2ML IJ SOLN
INTRAMUSCULAR | Status: DC | PRN
  Administered 2018-09-14: 4 mg via INTRAVENOUS

## 2018-09-14 MED ORDER — SUGAMMADEX SODIUM 200 MG/2ML IV SOLN
INTRAVENOUS | Status: AC
  Filled 2018-09-14: qty 2

## 2018-09-14 MED ORDER — ONDANSETRON HCL 4 MG/2ML IJ SOLN: 4.0000 mg | INTRAMUSCULAR | Status: DC | PRN

## 2018-09-14 MED ORDER — DOCUSATE SODIUM 100 MG PO CAPS
100.0000 mg | ORAL_CAPSULE | Freq: Two times a day (BID) | ORAL | Status: DC
  Administered 2018-09-14 – 2018-09-15 (×2): 100 mg via ORAL
  Filled 2018-09-14 (×2): qty 1

## 2018-09-14 MED ORDER — CEFAZOLIN SODIUM-DEXTROSE 2-4 GM/100ML-% IV SOLN
INTRAVENOUS | Status: AC
  Filled 2018-09-14: qty 100

## 2018-09-14 MED ORDER — CEFAZOLIN SODIUM-DEXTROSE 1-4 GM/50ML-% IV SOLN
1.0000 g | Freq: Three times a day (TID) | INTRAVENOUS | Status: AC
  Administered 2018-09-14 – 2018-09-15 (×2): 1 g via INTRAVENOUS
  Filled 2018-09-14 (×2): qty 50

## 2018-09-14 MED ORDER — BACITRACIN-NEOMYCIN-POLYMYXIN 400-5-5000 EX OINT: 1.0000 "application " | TOPICAL_OINTMENT | Freq: Three times a day (TID) | CUTANEOUS | Status: DC | PRN

## 2018-09-14 MED ORDER — MORPHINE SULFATE (PF) 2 MG/ML IV SOLN: 2.0000 mg | INTRAVENOUS | Status: DC | PRN

## 2018-09-14 MED ORDER — CEFAZOLIN SODIUM-DEXTROSE 2-4 GM/100ML-% IV SOLN
2.0000 g | Freq: Once | INTRAVENOUS | Status: AC
  Administered 2018-09-14 (×2): 2 g via INTRAVENOUS

## 2018-09-14 MED ORDER — LIDOCAINE 2% (20 MG/ML) 5 ML SYRINGE
INTRAMUSCULAR | Status: DC | PRN
  Administered 2018-09-14: 80 mg via INTRAVENOUS

## 2018-09-14 MED ORDER — BELLADONNA ALKALOIDS-OPIUM 16.2-60 MG RE SUPP: 1.0000 | Freq: Four times a day (QID) | RECTAL | Status: DC | PRN

## 2018-09-14 MED ORDER — EPHEDRINE SULFATE-NACL 50-0.9 MG/10ML-% IV SOSY
PREFILLED_SYRINGE | INTRAVENOUS | Status: DC | PRN
  Administered 2018-09-14 (×2): 5 mg via INTRAVENOUS

## 2018-09-14 MED ORDER — KCL IN DEXTROSE-NACL 20-5-0.45 MEQ/L-%-% IV SOLN
INTRAVENOUS | Status: DC
  Administered 2018-09-14 – 2018-09-15 (×3): via INTRAVENOUS
  Filled 2018-09-14 (×4): qty 1000

## 2018-09-14 MED ORDER — PROPOFOL 10 MG/ML IV BOLUS
INTRAVENOUS | Status: AC
  Filled 2018-09-14: qty 20

## 2018-09-14 MED ORDER — HEPARIN SODIUM (PORCINE) 1000 UNIT/ML IJ SOLN
INTRAMUSCULAR | Status: AC
  Filled 2018-09-14: qty 1

## 2018-09-14 MED ORDER — ROCURONIUM BROMIDE 10 MG/ML (PF) SYRINGE
PREFILLED_SYRINGE | INTRAVENOUS | Status: DC | PRN
  Administered 2018-09-14 (×2): 20 mg via INTRAVENOUS
  Administered 2018-09-14: 60 mg via INTRAVENOUS
  Administered 2018-09-14 (×3): 10 mg via INTRAVENOUS

## 2018-09-14 MED ORDER — TRAMADOL HCL 50 MG PO TABS: 50.0000 mg | ORAL_TABLET | Freq: Four times a day (QID) | ORAL | 0 refills | Status: AC | PRN

## 2018-09-14 MED ORDER — INDIGOTINDISULFONATE SODIUM 8 MG/ML IJ SOLN
INTRAMUSCULAR | Status: DC | PRN
  Administered 2018-09-14: 5 mL via INTRAVENOUS

## 2018-09-14 MED ORDER — ONDANSETRON HCL 4 MG/2ML IJ SOLN
INTRAMUSCULAR | Status: AC
  Filled 2018-09-14: qty 2

## 2018-09-14 MED ORDER — KETOROLAC TROMETHAMINE 15 MG/ML IJ SOLN
15.0000 mg | Freq: Four times a day (QID) | INTRAMUSCULAR | Status: DC
  Administered 2018-09-14 – 2018-09-15 (×5): 15 mg via INTRAVENOUS
  Filled 2018-09-14 (×5): qty 1

## 2018-09-14 MED ORDER — AMLODIPINE BESYLATE 10 MG PO TABS
10.0000 mg | ORAL_TABLET | Freq: Every day | ORAL | Status: DC
  Administered 2018-09-15: 10 mg via ORAL
  Filled 2018-09-14: qty 1

## 2018-09-14 MED ORDER — LIDOCAINE 2% (20 MG/ML) 5 ML SYRINGE
INTRAMUSCULAR | Status: AC
  Filled 2018-09-14: qty 5

## 2018-09-14 MED ORDER — DIPHENHYDRAMINE HCL 12.5 MG/5ML PO ELIX: 12.5000 mg | ORAL_SOLUTION | Freq: Four times a day (QID) | ORAL | Status: DC | PRN

## 2018-09-14 MED ORDER — ROCURONIUM BROMIDE 10 MG/ML (PF) SYRINGE
PREFILLED_SYRINGE | INTRAVENOUS | Status: AC
  Filled 2018-09-14: qty 10

## 2018-09-14 MED ORDER — MIDAZOLAM HCL 2 MG/2ML IJ SOLN
INTRAMUSCULAR | Status: AC
  Filled 2018-09-14: qty 2

## 2018-09-14 MED ORDER — BUPIVACAINE-EPINEPHRINE (PF) 0.25% -1:200000 IJ SOLN
INTRAMUSCULAR | Status: DC | PRN
  Administered 2018-09-14: 30 mL

## 2018-09-14 MED ORDER — DEXAMETHASONE SODIUM PHOSPHATE 10 MG/ML IJ SOLN
INTRAMUSCULAR | Status: AC
  Filled 2018-09-14: qty 1

## 2018-09-14 MED ORDER — SODIUM CHLORIDE 0.9 % IV BOLUS
1000.0000 mL | Freq: Once | INTRAVENOUS | Status: AC
  Administered 2018-09-14: 1000 mL via INTRAVENOUS

## 2018-09-14 MED ORDER — SUGAMMADEX SODIUM 200 MG/2ML IV SOLN
INTRAVENOUS | Status: DC | PRN
  Administered 2018-09-14: 200 mg via INTRAVENOUS

## 2018-09-14 MED ORDER — BUPIVACAINE-EPINEPHRINE (PF) 0.25% -1:200000 IJ SOLN
INTRAMUSCULAR | Status: AC
  Filled 2018-09-14: qty 30

## 2018-09-14 MED ORDER — FENTANYL CITRATE (PF) 100 MCG/2ML IJ SOLN
INTRAMUSCULAR | Status: DC | PRN
  Administered 2018-09-14 (×3): 50 ug via INTRAVENOUS
  Administered 2018-09-14: 100 ug via INTRAVENOUS

## 2018-09-14 MED ORDER — SULFAMETHOXAZOLE-TRIMETHOPRIM 800-160 MG PO TABS: 1.0000 | ORAL_TABLET | Freq: Two times a day (BID) | ORAL | 0 refills | Status: AC

## 2018-09-14 MED ORDER — HYDROMORPHONE HCL 1 MG/ML IJ SOLN: 0.2500 mg | INTRAMUSCULAR | Status: DC | PRN

## 2018-09-14 MED ORDER — EPHEDRINE 5 MG/ML INJ
INTRAVENOUS | Status: AC
  Filled 2018-09-14: qty 10

## 2018-09-14 MED ORDER — LACTATED RINGERS IV SOLN
INTRAVENOUS | Status: DC | PRN
  Administered 2018-09-14: 1000 mL

## 2018-09-14 MED ORDER — MIDAZOLAM HCL 5 MG/5ML IJ SOLN
INTRAMUSCULAR | Status: DC | PRN
  Administered 2018-09-14: 2 mg via INTRAVENOUS

## 2018-09-14 MED ORDER — DEXAMETHASONE SODIUM PHOSPHATE 10 MG/ML IJ SOLN
INTRAMUSCULAR | Status: DC | PRN
  Administered 2018-09-14: 8 mg via INTRAVENOUS

## 2018-09-14 MED ORDER — INDIGOTINDISULFONATE SODIUM 8 MG/ML IJ SOLN
INTRAMUSCULAR | Status: AC
  Filled 2018-09-14: qty 5

## 2018-09-14 MED ORDER — ACETAMINOPHEN 325 MG PO TABS: 650.0000 mg | ORAL_TABLET | ORAL | Status: DC | PRN

## 2018-09-14 SURGICAL SUPPLY — 60 items
APPLICATOR COTTON TIP 6 STRL (MISCELLANEOUS) ×2 IMPLANT
APPLICATOR COTTON TIP 6IN STRL (MISCELLANEOUS) ×4
CATH FOLEY 2WAY SLVR 18FR 30CC (CATHETERS) ×4 IMPLANT
CATH ROBINSON RED A/P 16FR (CATHETERS) ×4 IMPLANT
CATH ROBINSON RED A/P 8FR (CATHETERS) ×4 IMPLANT
CATH TIEMANN FOLEY 18FR 5CC (CATHETERS) ×4 IMPLANT
CHLORAPREP W/TINT 26ML (MISCELLANEOUS) ×4 IMPLANT
CLIP VESOLOCK LG 6/CT PURPLE (CLIP) ×12 IMPLANT
COVER SURGICAL LIGHT HANDLE (MISCELLANEOUS) ×4 IMPLANT
COVER TIP SHEARS 8 DVNC (MISCELLANEOUS) ×2 IMPLANT
COVER TIP SHEARS 8MM DA VINCI (MISCELLANEOUS) ×2
COVER WAND RF STERILE (DRAPES) ×4 IMPLANT
CUTTER ECHEON FLEX ENDO 45 340 (ENDOMECHANICALS) ×4 IMPLANT
DECANTER SPIKE VIAL GLASS SM (MISCELLANEOUS) IMPLANT
DERMABOND ADVANCED (GAUZE/BANDAGES/DRESSINGS) ×2
DERMABOND ADVANCED .7 DNX12 (GAUZE/BANDAGES/DRESSINGS) ×2 IMPLANT
DRAPE ARM DVNC X/XI (DISPOSABLE) ×8 IMPLANT
DRAPE COLUMN DVNC XI (DISPOSABLE) ×2 IMPLANT
DRAPE DA VINCI XI ARM (DISPOSABLE) ×8
DRAPE DA VINCI XI COLUMN (DISPOSABLE) ×2
DRAPE SURG IRRIG POUCH 19X23 (DRAPES) ×4 IMPLANT
DRSG TEGADERM 4X4.75 (GAUZE/BANDAGES/DRESSINGS) ×4 IMPLANT
ELECT REM PT RETURN 15FT ADLT (MISCELLANEOUS) ×4 IMPLANT
GLOVE BIO SURGEON STRL SZ 6.5 (GLOVE) ×6 IMPLANT
GLOVE BIO SURGEONS STRL SZ 6.5 (GLOVE) ×2
GLOVE BIOGEL M STRL SZ7.5 (GLOVE) ×8 IMPLANT
GLOVE BIOGEL PI IND STRL 7.0 (GLOVE) ×2 IMPLANT
GLOVE BIOGEL PI INDICATOR 7.0 (GLOVE) ×2
GOWN STRL REUS W/TWL LRG LVL3 (GOWN DISPOSABLE) ×20 IMPLANT
HOLDER FOLEY CATH W/STRAP (MISCELLANEOUS) ×4 IMPLANT
IRRIG SUCT STRYKERFLOW 2 WTIP (MISCELLANEOUS) ×4
IRRIGATION SUCT STRKRFLW 2 WTP (MISCELLANEOUS) ×2 IMPLANT
IV LACTATED RINGERS 1000ML (IV SOLUTION) ×4 IMPLANT
NDL SAFETY ECLIPSE 18X1.5 (NEEDLE) ×2 IMPLANT
NEEDLE HYPO 18GX1.5 SHARP (NEEDLE) ×2
PACK ROBOT UROLOGY CUSTOM (CUSTOM PROCEDURE TRAY) ×4 IMPLANT
SEAL CANN UNIV 5-8 DVNC XI (MISCELLANEOUS) ×8 IMPLANT
SEAL XI 5MM-8MM UNIVERSAL (MISCELLANEOUS) ×8
SLEEVE SURGEON STRL (DRAPES) ×4 IMPLANT
SOLUTION ELECTROLUBE (MISCELLANEOUS) ×4 IMPLANT
STAPLE RELOAD 45 GRN (STAPLE) ×2 IMPLANT
STAPLE RELOAD 45MM GREEN (STAPLE) ×2
SUT ETHILON 3 0 PS 1 (SUTURE) ×4 IMPLANT
SUT MNCRL 3 0 RB1 (SUTURE) ×2 IMPLANT
SUT MNCRL 3 0 VIOLET RB1 (SUTURE) ×2 IMPLANT
SUT MNCRL AB 4-0 PS2 18 (SUTURE) ×8 IMPLANT
SUT MONOCRYL 3 0 RB1 (SUTURE) ×4
SUT VIC AB 0 CT1 27 (SUTURE) ×2
SUT VIC AB 0 CT1 27XBRD ANTBC (SUTURE) ×2 IMPLANT
SUT VIC AB 0 UR5 27 (SUTURE) ×4 IMPLANT
SUT VIC AB 2-0 SH 27 (SUTURE) ×4
SUT VIC AB 2-0 SH 27X BRD (SUTURE) ×4 IMPLANT
SUT VIC AB 3-0 SH 27 (SUTURE) ×4
SUT VIC AB 3-0 SH 27X BRD (SUTURE) ×4 IMPLANT
SUT VICRYL 0 UR6 27IN ABS (SUTURE) ×8 IMPLANT
SYR 27GX1/2 1ML LL SAFETY (SYRINGE) ×4 IMPLANT
TOWEL OR 17X26 10 PK STRL BLUE (TOWEL DISPOSABLE) IMPLANT
TOWEL OR NON WOVEN STRL DISP B (DISPOSABLE) ×4 IMPLANT
TUBING INSUFFLATION 10FT LAP (TUBING) IMPLANT
WATER STERILE IRR 1000ML POUR (IV SOLUTION) ×8 IMPLANT

## 2018-09-14 NOTE — Progress Notes (Signed)
Patient ID: Dale Ryan, male   DOB: [birth year], 72 y.o.   MRN: [id number] Post-op note  Subjective: The patient is doing well.  No complaints.  Denies N/V  Objective: Vital signs in last 24 hours: Temp:  [97.7 F (36.5 C)-99.6 F (37.6 C)] 98.7 F (37.1 C) (12/19 1420) Pulse Rate:  [78-92] 92 (12/19 1420) Resp:  [15-25] 16 (12/19 1420) BP: (136-160)/(68-120) 147/76 (12/19 1420) SpO2:  [100 %] 100 % (12/19 1420) Weight:  [85 kg-85.3 kg] 85.3 kg (12/19 1420)  Intake/Output from previous day: No intake/output data recorded. Intake/Output this shift: Total I/O In: 3300 [I.V.:1900; IV Piggyback:1400] Out: 520 [Urine:400; Drains:20; Blood:100]  Physical Exam:  General: Alert and oriented. Abdomen: Soft, Nondistended. Incisions: Clean and dry. Urine: clear  Lab Results: Recent Labs    09/14/18 1230  HGB 11.9*  HCT 37.2*    Assessment/Plan: POD#0   1) Continue to monitor  2) DVT prophy, clears, IS, amb, pain control   LOS: 0 days   Debbrah Alar 09/14/2018, 3:34 PM

## 2018-09-14 NOTE — Discharge Instructions (Signed)

## 2018-09-14 NOTE — Transfer of Care (Signed)
Immediate Anesthesia Transfer of Care Note  Patient: Dale Ryan  Procedure(s) Performed: XI ROBOTIC ASSISTED LAPAROSCOPIC RADICAL PROSTATECTOMY LEVEL 3 (N/A ) LYMPHADENECTOMY, PELVIC  SALVAGE (Bilateral )  Patient Location: PACU  Anesthesia Type:General  Level of Consciousness: awake, alert , oriented and patient cooperative  Airway & Oxygen Therapy: Patient Spontanous Breathing and Patient connected to face mask oxygen  Post-op Assessment: Report given to RN, Post -op Vital signs reviewed and stable and Patient moving all extremities  Post vital signs: Reviewed and stable  Last Vitals:  Vitals Value Taken Time  BP 143/75 09/14/2018 12:02 PM  Temp    Pulse 81 09/14/2018 12:05 PM  Resp 25 09/14/2018 12:05 PM  SpO2 100 % 09/14/2018 12:05 PM  Vitals shown include unvalidated device data.  Last Pain:  Vitals:   09/14/18 0600  TempSrc:   PainSc: 0-No pain         Complications: No apparent anesthesia complications

## 2018-09-14 NOTE — Anesthesia Postprocedure Evaluation (Signed)
Anesthesia Post Note  Patient: Dale Ryan  Procedure(s) Performed: XI ROBOTIC ASSISTED LAPAROSCOPIC RADICAL PROSTATECTOMY LEVEL 3 (N/A ) LYMPHADENECTOMY, PELVIC  SALVAGE (Bilateral )     Patient location during evaluation: PACU Anesthesia Type: General Level of consciousness: awake and alert Pain management: pain level controlled Vital Signs Assessment: post-procedure vital signs reviewed and stable Respiratory status: spontaneous breathing, nonlabored ventilation, respiratory function stable and patient connected to nasal cannula oxygen Cardiovascular status: blood pressure returned to baseline and stable Postop Assessment: no apparent nausea or vomiting Anesthetic complications: no    Last Vitals:  Vitals:   09/14/18 1202 09/14/18 1215  BP: (!) 143/75 (!) 145/70  Pulse: 88   Resp: 15   Temp: 36.7 C   SpO2: 100%     Last Pain:  Vitals:   09/14/18 1230  TempSrc:   PainSc: 0-No pain                 Darlina Mccaughey,W. EDMOND

## 2018-09-14 NOTE — Discharge Summary (Signed)
[facility] Discharge Summary  Admit date: 09/14/2018  Discharge date and time: 09/15/18   Discharge to: Home  Discharge Service: Urology  Discharge Attending Physician: Raynelle Bring, MD  Discharge  Diagnoses: Prostate cancer   Secondary Diagnosis: Active Problems:   Prostate cancer ([place])   OR Procedures: Procedure(s): XI ROBOTIC ASSISTED LAPAROSCOPIC RADICAL PROSTATECTOMY LEVEL 3 LYMPHADENECTOMY, PELVIC  SALVAGE 09/14/2018   Ancillary Procedures: None   Discharge Day Services: The patient was seen and examined by the Urology team both in the morning and immediately prior to discharge.  Vital signs and laboratory values were stable and within normal limits.  The physical exam was benign and unchanged and all surgical wounds were examined.  Discharge instructions were explained and all questions answered.  Subjective  No acute events overnight. Pain Controlled. No fever or chills.  Objective No data found. Total I/O In: -  Out: 340 [Urine:300; Drains:40]  General Appearance:        No acute distress Lungs:                       Normal work of breathing on room air Heart:                                Regular rate and rhythm Abdomen:                         Soft, non-tender, non-distended, incision c/d/i with dermabond Extremities:                      Warm and well perfused   Hospital Course:  The patient underwent salvage robot assisted laparoscopic prostatectomy on 09/14/2018.  The patient tolerated the procedure well, was extubated in the OR, and afterwards was taken to the PACU for routine post-surgical care. When stable the patient was transferred to the floor.   The patient did well postoperatively.  The patient's diet was slowly advanced and at the time of discharge was tolerating a clear diet. He was instructed to advance based on bowel function. The patient was discharged home on POD1, at which point was tolerating a clear liquid diet, have adequate pain  control with P.O. pain medication, and could ambulate without difficulty. The patient will follow up with [place] for post op check. Passing flatus at time of discharge. Discharged with foley in place.   Condition at Discharge: Improved  Discharge Medications:  Allergies as of 09/15/2018   No Known Allergies     Medication List    STOP taking these medications   multivitamin capsule     TAKE these medications   amLODipine 10 MG tablet Commonly known as:  NORVASC Take 10 mg by mouth daily.   hydrochlorothiazide 12.5 MG capsule Commonly known as:  MICROZIDE Take 12.5 mg by mouth daily.   sulfamethoxazole-trimethoprim 800-160 MG tablet Commonly known as:  BACTRIM DS,SEPTRA DS Take 1 tablet by mouth 2 (two) times daily. Start the day prior to foley removal appointment   traMADol 50 MG tablet Commonly known as:  ULTRAM Take 1-2 tablets (50-100 mg total) by mouth every 6 (six) hours as needed for moderate pain or severe pain.

## 2018-09-14 NOTE — Anesthesia Procedure Notes (Signed)

## 2018-09-14 NOTE — Interval H&P Note (Signed)
History and Physical Interval Note:  09/14/2018 6:56 AM  Dale Ryan  has presented today for surgery, with the diagnosis of PROSTATE CANCER  The various methods of treatment have been discussed with the patient and family. After consideration of risks, benefits and other options for treatment, the patient has consented to  Procedure(s) with comments: XI ROBOTIC ASSISTED LAPAROSCOPIC RADICAL PROSTATECTOMY LEVEL 3 (N/A) - NEEDS 210 MIN TOTAL FOR ALL PROCEDURES LYMPHADENECTOMY, PELVIC  SALVAGE (Bilateral) as a surgical intervention .  The patient's history has been reviewed, patient examined, no change in status, stable for surgery.  I have reviewed the patient's chart and labs.  Questions were answered to the patient's satisfaction.     Kyden Potash,LES

## 2018-09-14 NOTE — Op Note (Signed)
Preoperative diagnosis: Clinically localized adenocarcinoma of the prostate (clinical stage T1c N0 M0)  Postoperative diagnosis: Clinically localized adenocarcinoma of the prostate (clinical stage T1c N0 M0)  Procedure:  1. Salvage robotic assisted laparoscopic radical prostatectomy (non nerve sparing) (this procedure was technically more difficult due to the patient's prior radiation therapy requiring significant more time than a typical robotic prostatectomy - more than 30% of the normal expected time for this procedure) 2. Bilateral robotic assisted laparoscopic pelvic lymphadenectomy  Surgeon: Pryor Curia. M.D.  Assistant(s): Debbrah Alar, PA-C  An assistant was required for this surgical procedure.  The duties of the assistant included but were not limited to suctioning, passing suture, camera manipulation, retraction. This procedure would not be able to be performed without an Environmental consultant.  Resident: Dr. Arminda Resides  Anesthesia: General  Complications: None  EBL: 100 mL  IVF:  1500 mL crystalloid  Specimens: 1. Prostate and seminal vesicles 2. Right pelvic lymph nodes 3. Left pelvic lymph nodes  Disposition of specimens: Pathology  Drains: 1. 20 Fr coude catheter 2. # 19 Blake pelvic drain  Indication: Dale Ryan is a 72 y.o. year old patient with clinically localized prostate cancer s/p primary treatment with radiation therapy in 2004.Marland Kitchen  After a thorough review of the management options for treatment of prostate cancer, he elected to proceed with surgical therapy and the above procedure(s).  We have discussed the potential benefits and risks of the procedure, side effects of the proposed treatment, the likelihood of the patient achieving the goals of the procedure, and any potential problems that might occur during the procedure or recuperation. Informed consent has been obtained.  Description of procedure:  The patient was taken to the operating room and  a general anesthetic was administered. He was given preoperative antibiotics, placed in the dorsal lithotomy position, and prepped and draped in the usual sterile fashion. Next a preoperative timeout was performed. A urethral catheter was placed into the bladder and a site was selected near the umbilicus for placement of the camera port. This was placed using a standard open Hassan technique which allowed entry into the peritoneal cavity under direct vision and without difficulty. A 12 mm port was placed and a pneumoperitoneum established. The camera was then used to inspect the abdomen and there was no evidence of any intra-abdominal injuries or other abnormalities aside from his colon being very dilated. The remaining abdominal ports were then placed. 8 mm robotic ports were placed in the right lower quadrant, left lower quadrant, and far left lateral abdominal wall. A 5 mm port was placed in the right upper quadrant and a 12 mm port was placed in the right lateral abdominal wall for laparoscopic assistance. All ports were placed under direct vision without difficulty. The surgical cart was then docked.   Utilizing the cautery scissors, the bladder was reflected posteriorly allowing entry into the space of Retzius and identification of the endopelvic fascia and prostate.  There was noted to be fibrosis in this space consistent with his history of radiation therapy. The periprostatic fat was then removed from the prostate allowing full exposure of the endopelvic fascia. The endopelvic fascia was then incised from the apex back to the base of the prostate bilaterally and the underlying levator muscle fibers were swept laterally off the prostate thereby isolating the dorsal venous complex. The dorsal vein was then stapled and divided with a 45 mm Flex Echelon stapler. Attention then turned to the bladder neck which was divided anteriorly  thereby allowing entry into the bladder and exposure of the urethral catheter.  The catheter balloon was deflated and the catheter was brought into the operative field and used to retract the prostate anteriorly. The posterior bladder neck was then examined.  There was a very large median lobe.  Indigo carmine was administered and the ureteral orifices were identified. The bladder neck posteriorly was divided allowing further dissection between the bladder and prostate posteriorly until the vasa deferentia and seminal vessels were identified. The vasa deferentia were isolated, divided, and lifted anteriorly. The seminal vesicles were dissected down to their tips with care to control the seminal vascular arterial blood supply. These structures and this space was very fibrotic. These structures were then lifted anteriorly and the space between Denonvillier's fascia and the anterior rectum was developed with a combination of sharp and blunt dissection carefully.  This space was also quite fibrotic requiring tedious dissection. This isolated the vascular pedicles of the prostate.  A wide non nerve sparing dissection was performed with Weck clips used to ligate the vascular pedicles of the prostate bilaterally. The vascular pedicles of the prostate were then divided.  The urethra was then sharply transected allowing the prostate specimen to be disarticulated. The pelvis was copiously irrigated and hemostasis was ensured. There was no evidence for rectal injury.  Attention then turned to the right pelvic sidewall. The fibrofatty tissue between the external iliac vein, confluence of the iliac vessels, hypogastric artery, and Cooper's ligament was dissected free from the pelvic sidewall with care to preserve the obturator nerve. Weck clips were used for lymphostasis and hemostasis. An identical procedure was performed on the contralateral side and the lymphatic packets were removed for permanent pathologic analysis.  Attention then turned to the urethral anastomosis. The bladder neck was first  reconstructed.  3-0 vicryl sutures were placed at the 3 and 9 o'clock positions in a figure of eight fashion to close the bladder neck. A 2-0 Vicryl slip knot was placed between Denonvillier's fascia, the posterior bladder neck, and the posterior urethra to reapproximate these structures. A double-armed 3-0 Monocryl suture was then used to perform a 360 running tension-free anastomosis between the bladder neck and urethra. A new urethral catheter was then placed into the bladder and irrigated. There were no blood clots within the bladder and the anastomosis appeared to be watertight. A #19 Blake drain was then brought through the left lateral 8 mm port site and positioned appropriately within the pelvis. It was secured to the skin with a nylon suture. The surgical cart was then undocked. The right lateral 12 mm port site was closed at the fascial level with a 0 Vicryl suture placed laparoscopically. All remaining ports were then removed under direct vision. The prostate specimen was removed intact within the Endopouch retrieval bag via the periumbilical camera port site. This fascial opening was closed with two running 0 Vicryl sutures. 0.25% Marcaine was then injected into all port sites and all incisions were reapproximated at the skin level with 3-0 Monocryl subcuticular sutures. Dermabond was applied to the skin. The patient appeared to tolerate the procedure well and without complications. The patient was able to be extubated and transferred to the recovery unit in satisfactory condition.  Pryor Curia MD

## 2018-09-15 ENCOUNTER — Encounter (HOSPITAL_COMMUNITY): Payer: Self-pay | Admitting: Urology

## 2018-09-15 ENCOUNTER — Encounter: Payer: Self-pay | Admitting: Medical Oncology

## 2018-09-15 DIAGNOSIS — C61 Malignant neoplasm of prostate: Secondary | ICD-10-CM | POA: Diagnosis not present

## 2018-09-15 LAB — HEMOGLOBIN AND HEMATOCRIT, BLOOD
HCT: 34.7 % — ABNORMAL LOW (ref 39.0–52.0)
HEMOGLOBIN: 11.4 g/dL — AB (ref 13.0–17.0)

## 2018-09-15 MED ORDER — TRAMADOL HCL 50 MG PO TABS: 50.0000 mg | ORAL_TABLET | Freq: Four times a day (QID) | ORAL | Status: DC | PRN

## 2018-09-15 MED ORDER — SENNA 8.6 MG PO TABS: 1.0000 | ORAL_TABLET | Freq: Every day | ORAL | Status: DC

## 2018-09-15 MED ORDER — BISACODYL 10 MG RE SUPP
10.0000 mg | Freq: Once | RECTAL | Status: AC
  Administered 2018-09-15: 10 mg via RECTAL
  Filled 2018-09-15: qty 1

## 2018-09-15 NOTE — Progress Notes (Signed)
Mr. Dale Ryan states surgery went well. He has discomfort but no true pain. We discussed the importance of drinking fluids, walking and doing his breathing exercises after discharge. He voiced understanding. He will follow up with Dr. Alinda Money 12/27 to get his catheter removed and to review pathology results.

## 2018-09-15 NOTE — Progress Notes (Signed)
Urology Progress Note   1 Day Post-Op  Subjective: NAEON. Pain controlled. Tolerating clears. Passing flatus this AM. Has been OOB and walking the hallways.   Objective: Vital signs in last 24 hours: Temp:  [98 F (36.7 C)-99.6 F (37.6 C)] 98.8 F (37.1 C) (12/20 0408) Pulse Rate:  [71-93] 71 (12/20 0408) Resp:  [15-25] 20 (12/20 0408) BP: (123-153)/(68-120) 132/68 (12/20 0408) SpO2:  [97 %-100 %] 97 % (12/20 0408) Weight:  [85.3 kg] 85.3 kg (12/19 1420)  Intake/Output from previous day: 12/19 0701 - 12/20 0700 In: 7825.3 [P.O.:1220; I.V.:4315.8; IV Piggyback:2289.4] Out: 3795 [Urine:3550; Drains:145; Blood:100] Intake/Output this shift: Total I/O In: 3065.3 [P.O.:360; I.V.:1815.8; IV Piggyback:889.4] Out: 2630 [Urine:2525; Drains:105]  Physical Exam:  General: Alert and oriented CV: extremities warm and well perfused Lungs: NWOB Abdomen: Soft, non-tender. Distended. JP SS.  GU: Foley in place draining clear yellow urine  Ext: NT, No erythema  Lab Results: Recent Labs    09/14/18 1230 09/15/18 0537  HGB 11.9* 11.4*  HCT 37.2* 34.7*   BMET No results for input(s): NA, K, CL, CO2, GLUCOSE, BUN, CREATININE, CALCIUM in the last 72 hours.   Studies/Results: No results found.  Assessment/Plan:  72 y.o. male s/p salvage RALP on 09/15/18.  Overall doing well post-op.   - Given passing flatus, consider advancing to full liquid diet.  - [place] IVF. - Continue foley catheter. - Ambulate, IS. - SCDs, early ambulation for DVT ppx. - Anticipate possible discharge later today vs tomorrow.  - Monitor JP output this AM.     LOS: 0 days   Dorothey Baseman 09/15/2018, 6:26 AM

## 2019-11-29 ENCOUNTER — Ambulatory Visit: Payer: PRIVATE HEALTH INSURANCE

## 2019-12-23 IMAGING — PT NM PET NOPR SKULL BASE TO THIGH
8 series · 25 of 25 positions shown · non-contrast
Comparison: None.

CLINICAL DATA: Prostate carcinoma with biochemical recurrence. PSA
equal

EXAM:
NUCLEAR MEDICINE PET SKULL BASE TO THIGH
TECHNIQUE: 9.5 mCi F-18 Fluciclovine was injected intravenously. Full-ring PET
imaging was performed from the skull base to thigh after the
radiotracer. CT data was obtained and used for attenuation
correction and anatomic localization.

[Series 4: pet sk_thigh ac · axial · 5.0mm · 4.07mm/px · z∈[+826,+1774]mm · 6 of 238 slices shown]
[im 1/238]
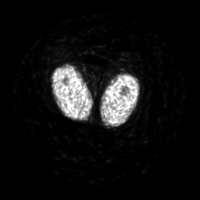
[im 48/238]
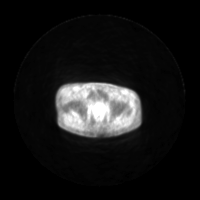
[im 95/238]
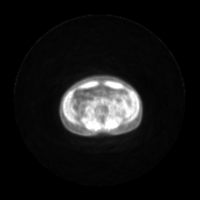
[im 143/238]
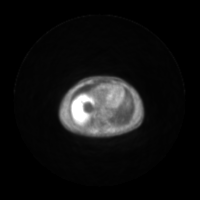
[im 190/238]
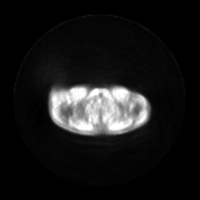
[im 238/238]
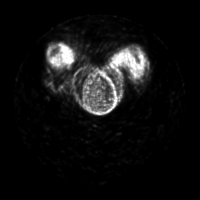

[Series 5: ct sk_thigh 5.0 b31f · axial · 5.0mm · 0.98mm/px · z∈[+826,+1774]mm · 5 of 238 slices shown]
[im 1/238]
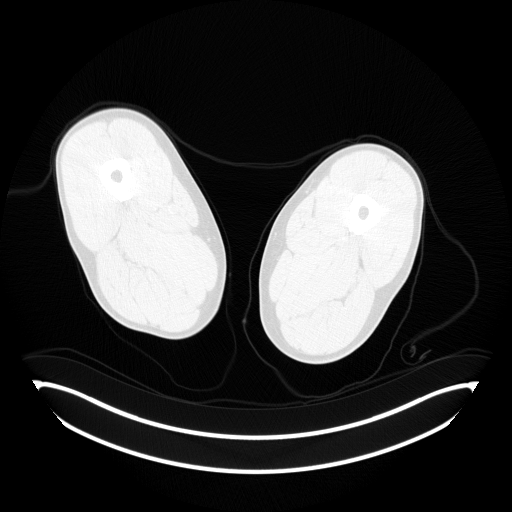
[im 60/238]
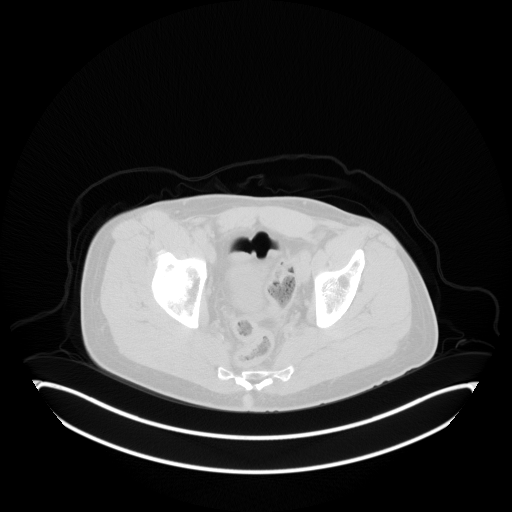
[im 119/238]
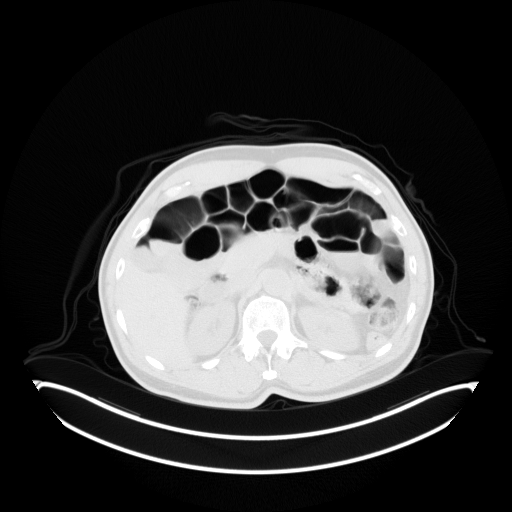
[im 178/238]
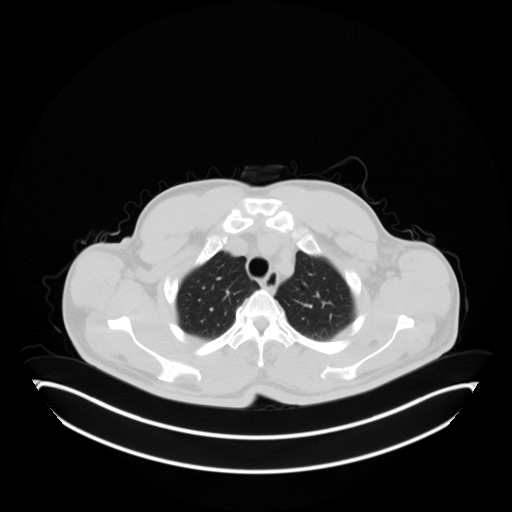
[im 238/238  brain]
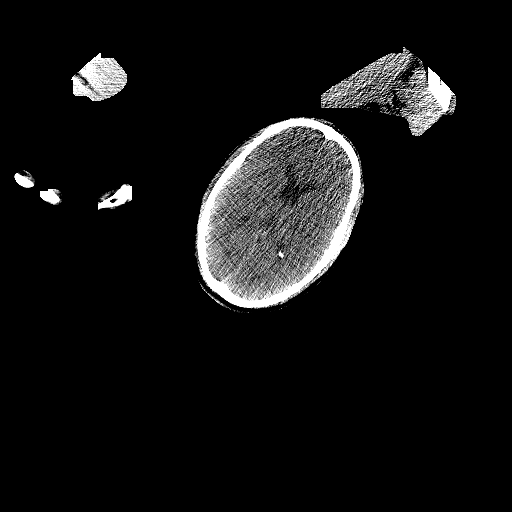

[Series 6: pet sk_thigh nac · axial · 5.0mm · 4.07mm/px · z∈[+826,+1774]mm · 5 of 238 slices shown]
[im 1/238]
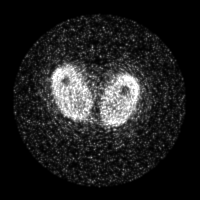
[im 60/238]
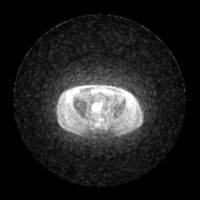
[im 119/238]
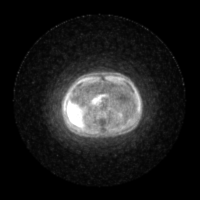
[im 178/238]
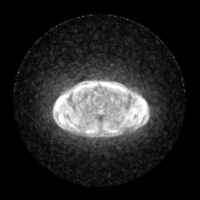
[im 238/238]
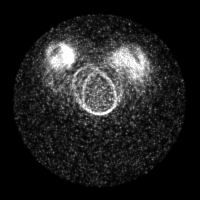

[Series 9: ct sk_thigh 5.0 (id) lung_bone · axial · 5.0mm · 0.60mm/px · 1 of 59 slices shown]
[im 1/59  bone]
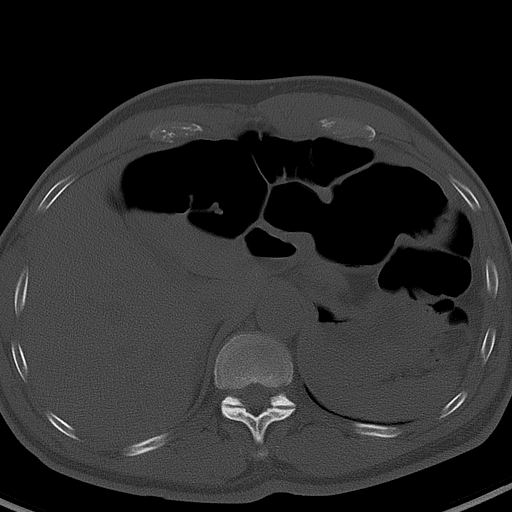

[Series 603: mip range · coronal · 1.97mm/px · 1 of 32 slices shown]
[im 1/32]
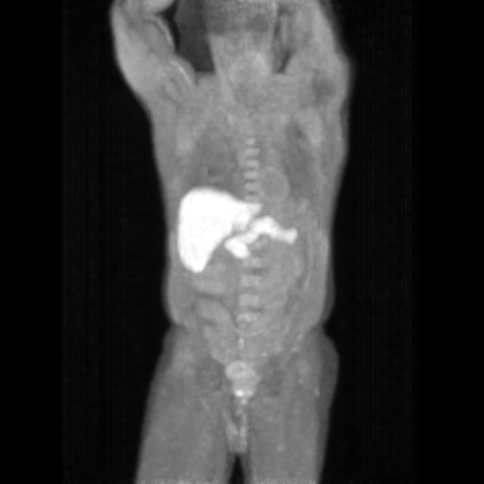

[Series 604: range-ct sk_thigh 5.0 (id)<alpha range> · 1 of 64 slices shown (1 of 2)]
[im 1/64]
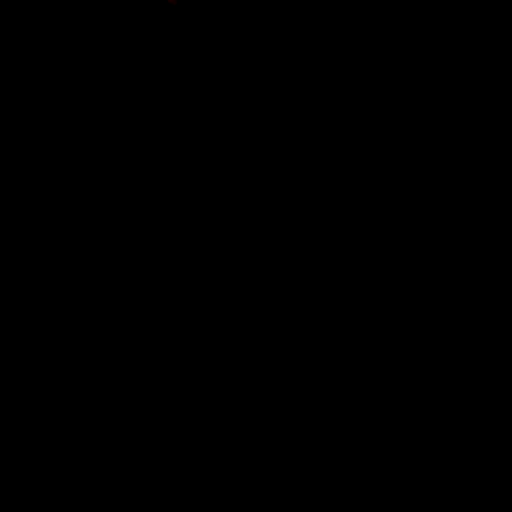

[Series 605: range-ct sk_thigh 5.0 (id)<alpha range> · 5 of 214 slices shown (2 of 2)]
[im 1/214]
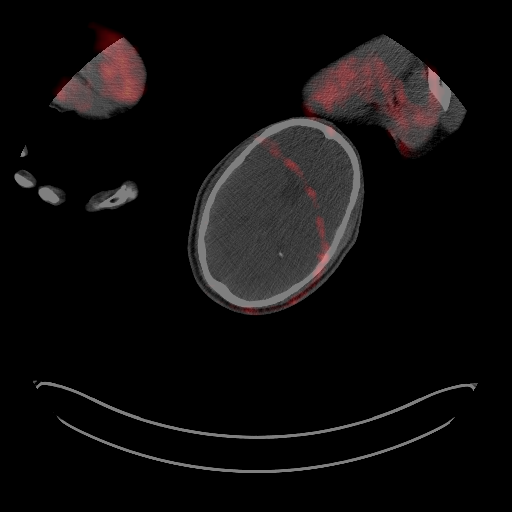
[im 54/214]
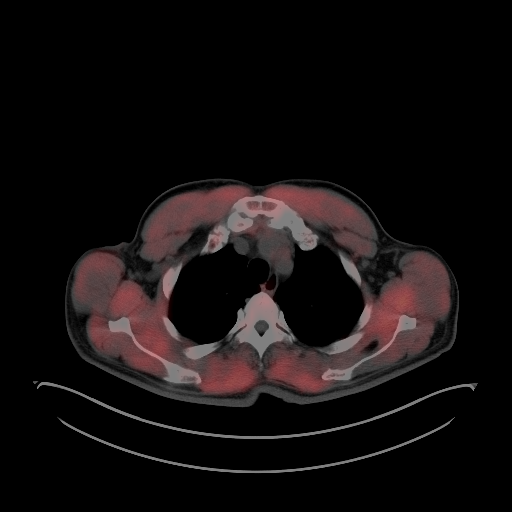
[im 107/214]
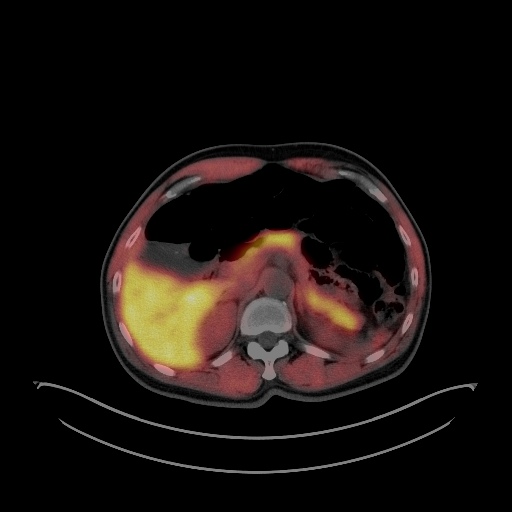
[im 160/214]
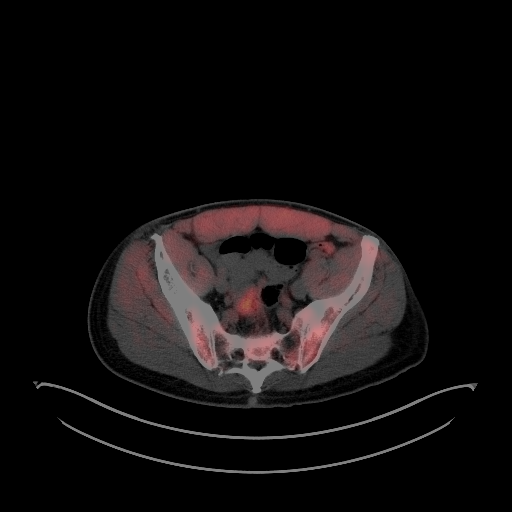
[im 214/214]
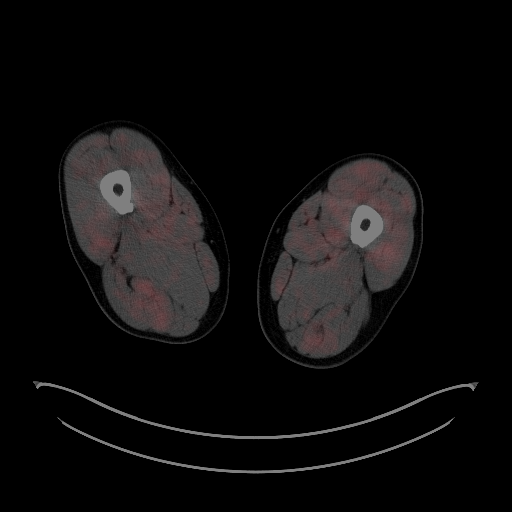

[Series 1072: results mm oncology reading · 1.0mm · 0.50mm/px · 1 of 2 slices shown]
[im 1/2]
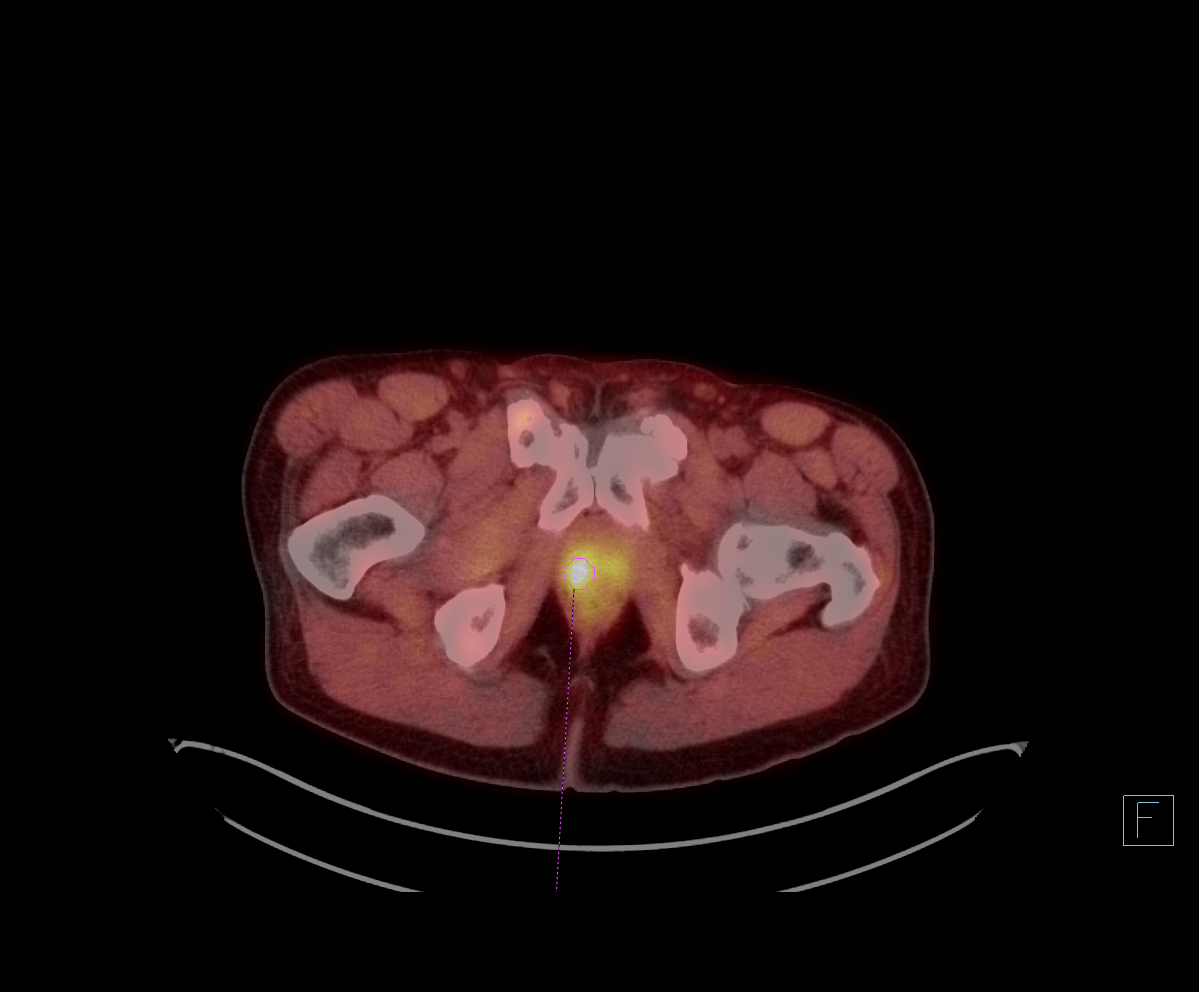

[25 of 25 positions shown; findings below may reference images not displayed]

FINDINGS: NECK

No radiotracer activity in neck lymph nodes.

Incidental CT finding: None

CHEST

No radiotracer accumulation within mediastinal or hilar lymph nodes.
No suspicious pulmonary nodules on the CT scan.

Incidental CT finding: None

ABDOMEN/PELVIS

Prostate: Asymmetric activity within the posterior RIGHT apex with
SUV max equal 6.1.

Lymph nodes: No abnormal radiotracer accumulation within pelvic or
abdominal nodes.

Liver: No evidence of liver metastasis

Incidental CT finding: Large hepatic cysts.

SKELETON

No focal activity to suggest skeletal metastasis. Remote pubic
symphysis fractures versus chronic degenerative change..
IMPRESSION: 1. Focal activity in the RIGHT lobe of the prostate gland may
represent residual or recurrent prostate carcinoma.
2. No evidence of metastatic adenopathy in the pelvis.
3. No evidence of distant soft tissue metastasis.
4. No evidence skeletal metastasis.
